# Patient Record
Sex: Female | Born: 1965 | Race: White | Hispanic: No | Marital: Married | Smoking: Former smoker
Health system: Southern US, Community
[De-identification: ages and names within clinical notes are randomized; demographics above are authoritative.]

## PROBLEM LIST (undated history)

## (undated) DIAGNOSIS — R7303 Prediabetes: Secondary | ICD-10-CM

## (undated) DIAGNOSIS — I10 Essential (primary) hypertension: Secondary | ICD-10-CM

## (undated) DIAGNOSIS — E7800 Pure hypercholesterolemia, unspecified: Secondary | ICD-10-CM

## (undated) DIAGNOSIS — M1990 Unspecified osteoarthritis, unspecified site: Secondary | ICD-10-CM

## (undated) DIAGNOSIS — J302 Other seasonal allergic rhinitis: Secondary | ICD-10-CM

## (undated) HISTORY — PX: CHOLECYSTECTOMY: SHX55

## (undated) HISTORY — PX: ESSURE TUBAL LIGATION: SUR464

## (undated) HISTORY — PX: NO PAST SURGERIES: SHX2092

---

## 2009-09-19 ENCOUNTER — Encounter: Admission: RE | Admit: 2009-09-19 | Discharge: 2009-09-19 | Payer: Self-pay | Admitting: Obstetrics and Gynecology

## 2009-09-26 ENCOUNTER — Encounter: Admission: RE | Admit: 2009-09-26 | Discharge: 2009-09-26 | Payer: Self-pay | Admitting: Obstetrics and Gynecology

## 2010-04-03 ENCOUNTER — Encounter: Admission: RE | Admit: 2010-04-03 | Discharge: 2010-04-03 | Payer: Self-pay | Admitting: Family Medicine

## 2010-10-17 ENCOUNTER — Encounter
Admission: RE | Admit: 2010-10-17 | Discharge: 2010-10-17 | Payer: Self-pay | Source: Home / Self Care | Attending: Obstetrics and Gynecology | Admitting: Obstetrics and Gynecology

## 2010-10-26 ENCOUNTER — Encounter: Payer: Self-pay | Admitting: Obstetrics and Gynecology

## 2011-04-13 ENCOUNTER — Other Ambulatory Visit (HOSPITAL_COMMUNITY): Payer: Self-pay | Admitting: Obstetrics and Gynecology

## 2011-04-13 DIAGNOSIS — N971 Female infertility of tubal origin: Secondary | ICD-10-CM

## 2011-04-15 ENCOUNTER — Ambulatory Visit (HOSPITAL_COMMUNITY)
Admission: RE | Admit: 2011-04-15 | Discharge: 2011-04-15 | Disposition: A | Discharge status: Home / Self Care | Payer: BC Managed Care – PPO | Source: Ambulatory Visit | Attending: Obstetrics and Gynecology | Admitting: Obstetrics and Gynecology

## 2011-04-15 DIAGNOSIS — N971 Female infertility of tubal origin: Secondary | ICD-10-CM

## 2011-04-15 DIAGNOSIS — Z3049 Encounter for surveillance of other contraceptives: Secondary | ICD-10-CM | POA: Insufficient documentation

## 2011-04-15 MED ORDER — IOHEXOL 300 MG/ML  SOLN: 3.0000 mL | Freq: Once | INTRAMUSCULAR | Status: AC | PRN

## 2011-05-06 ENCOUNTER — Other Ambulatory Visit: Payer: Self-pay

## 2011-05-06 ENCOUNTER — Encounter (HOSPITAL_COMMUNITY)
Admission: RE | Admit: 2011-05-06 | Discharge: 2011-05-06 | Disposition: A | Discharge status: Home / Self Care | Payer: BC Managed Care – PPO | Source: Ambulatory Visit | Attending: Obstetrics and Gynecology | Admitting: Obstetrics and Gynecology

## 2011-05-06 ENCOUNTER — Encounter (HOSPITAL_COMMUNITY): Payer: Self-pay

## 2011-05-06 HISTORY — DX: Other seasonal allergic rhinitis: J30.2

## 2011-05-06 HISTORY — DX: Essential (primary) hypertension: I10

## 2011-05-06 LAB — CBC
HCT: 39.7 % (ref 36.0–46.0)
MCHC: 32.5 g/dL (ref 30.0–36.0)
MCV: 90.6 fL (ref 78.0–100.0)
Platelets: 300 10*3/uL (ref 150–400)
RDW: 13.1 % (ref 11.5–15.5)
WBC: 9.5 10*3/uL (ref 4.0–10.5)

## 2011-05-06 NOTE — Anesthesia Preprocedure Evaluation (Signed)
Anesthesia Evaluation  Name, MR# and DOB Patient awake  General Assessment Comment  Reviewed: Allergy & Precautions, H&P , Patient's Chart, lab work & pertinent test results and reviewed documented beta blocker date and time   Airway Mallampati: I TM Distance: >3 FB Neck ROM: full    Dental   Pulmonary  clear to auscultation    Cardiovascular hypertension (on bystolic, takes at night, will take night before surgery), Pt. on home beta blockers regular Normal Had cardiac workup because of family history of early CAD - w/u was neg.  Has baseline EKG abnormality, but unchanged over past year.Had cardiac workup because of family history of early CAD - w/u was neg.  Has baseline EKG abnormality, but unchanged over past year.:    Neuro/Psych  GI/Hepatic/Renal   Endo/Other   (+)  Morbid obesity Abdominal   Musculoskeletal  Hematology   Peds  Reproductive/Obstetrics   Anesthesia Other Findings Top two front teeth grooved            Anesthesia Physical Anesthesia Plan  ASA: III  Anesthesia Plan: General   Post-op Pain Management:    Induction:   Airway Management Planned:   Additional Equipment:   Intra-op Plan:   Post-operative Plan:   Informed Consent: I have reviewed the patients History and Physical, chart, labs and discussed the procedure including the risks, benefits and alternatives for the proposed anesthesia with the patient or authorized representative who has indicated his/her understanding and acceptance.   Dental Advisory Given  Plan Discussed with:   Anesthesia Plan Comments:         Anesthesia Quick Evaluation

## 2011-05-06 NOTE — Patient Instructions (Addendum)
22 Sonya Watson  05/06/2011   Your procedure is scheduled on:  Wednesday  Enter through the Main Entrance of [facility] at 7 AM.  Pick up the phone at the desk and dial [phone].   Call this number if you have problems the morning of surgery: [phone]   Remember:   Do not eat food:After Midnight.  Do not drink clear liquids: After Midnight.  Take these medicines the morning of surgery with A SIP OF WATER: take bystolic as scheduled   Do not wear jewelry, make-up or nail polish.  Do not wear lotions, powders, or perfumes.   Do not shave 48 hours prior to surgery.  Do not bring valuables to the hospital.  Contacts, dentures or bridgework may not be worn into surgery.  Leave suitcase in the car. After surgery it may be brought to your room.  For patients admitted to the hospital, checkout time is 11:00 AM the day of discharge.   Patients discharged the day of surgery will not be allowed to drive home.  Name and phone number of your driver: Yasmene Salomone-  Special Instructions: none   Please read over the following fact sheets that you were given: hibiclens as instructed

## 2011-05-09 NOTE — H&P (Addendum)
Sonya Watson is an 45 y.o. female with an ongoing problem of menorrhagia for several years.  She has tried to manage this with many different brands of OCP's But has found that bleeding got worse with more BTB.  Cycles are every 21 days and have lasted up to 2 weeks at times.  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  She has had an Essure procedure in the past, recent HSG confirmed occlusion and a normal cavity.  Pertinent Gynecological History:   Contraception: Essure  Last pap: normal     Menstrual History:  Patient's last menstrual period was 04/06/2011.    Past Medical History  Diagnosis Date  . Hypertension     on bystolic-diagnosed htn 1 year ago  . Seasonal allergies     Past Surgical History  Procedure Date  . No past surgeries     No family history on file.  Social History:  reports that she has quit smoking. She quit smokeless tobacco use about 5 years ago. She reports that she drinks alcohol. She reports that she does not use illicit drugs.  Allergies: No Known Allergies  No prescriptions prior to admission    ROS  Last menstrual period 04/06/2011. Physical Exam  Constitutional: She is oriented to person, place, and time. She appears well-developed and well-nourished.  Cardiovascular: Normal rate and regular rhythm.   Respiratory: Effort normal and breath sounds normal.  GI: Soft. Bowel sounds are normal.  Genitourinary: Vagina normal and uterus normal.  Neurological: She is alert and oriented to person, place, and time.  Psychiatric: Her behavior is normal.       Assessment/Plan: Pt coming in for scheduled Novasure ablation.  Will have hysteroscopy and D&C to sample lining at the time of surgery.  HSG showed normal cavitiy prior to admission and Successful tubal occlusion with Essure.  Risks and benefits d/w pt in detail including uterine perforation, bleeding,or infection.  Pt desires to proceed.  Will use cytotec  3 hours prior to procedure. Given Script for vicodin preop to fill for use after procedure #20.  Illeana Edick W 05/09/2011, 12:02 AM  Per pt no changes have occurred in dictated in H&P.

## 2011-05-13 ENCOUNTER — Ambulatory Visit (HOSPITAL_COMMUNITY): Payer: BC Managed Care – PPO | Admitting: Anesthesiology

## 2011-05-13 ENCOUNTER — Encounter (HOSPITAL_COMMUNITY): Payer: Self-pay | Admitting: Obstetrics and Gynecology

## 2011-05-13 ENCOUNTER — Ambulatory Visit (HOSPITAL_COMMUNITY)
Admission: RE | Admit: 2011-05-13 | Discharge: 2011-05-13 | Disposition: A | Discharge status: Home / Self Care | Payer: BC Managed Care – PPO | Source: Ambulatory Visit | Attending: Obstetrics and Gynecology | Admitting: Obstetrics and Gynecology

## 2011-05-13 ENCOUNTER — Encounter (HOSPITAL_COMMUNITY)
Admission: RE | Disposition: A | Discharge status: Home / Self Care | Payer: Self-pay | Source: Ambulatory Visit | Attending: Obstetrics and Gynecology

## 2011-05-13 ENCOUNTER — Encounter (HOSPITAL_COMMUNITY): Payer: Self-pay | Admitting: Anesthesiology

## 2011-05-13 ENCOUNTER — Other Ambulatory Visit: Payer: Self-pay | Admitting: Obstetrics and Gynecology

## 2011-05-13 DIAGNOSIS — N938 Other specified abnormal uterine and vaginal bleeding: Secondary | ICD-10-CM

## 2011-05-13 DIAGNOSIS — Z01818 Encounter for other preprocedural examination: Secondary | ICD-10-CM | POA: Insufficient documentation

## 2011-05-13 DIAGNOSIS — Z01812 Encounter for preprocedural laboratory examination: Secondary | ICD-10-CM | POA: Insufficient documentation

## 2011-05-13 DIAGNOSIS — N920 Excessive and frequent menstruation with regular cycle: Secondary | ICD-10-CM | POA: Insufficient documentation

## 2011-05-13 SURGERY — DILATATION & CURETTAGE/HYSTEROSCOPY WITH NOVASURE ABLATION
Anesthesia: Choice | Site: Vagina | Wound class: Clean Contaminated

## 2011-05-13 MED ORDER — LACTATED RINGERS IV SOLN
INTRAVENOUS | Status: DC
  Administered 2011-05-13 (×3): via INTRAVENOUS

## 2011-05-13 MED ORDER — FENTANYL CITRATE 0.05 MG/ML IJ SOLN
INTRAMUSCULAR | Status: DC | PRN
  Administered 2011-05-13 (×2): 50 ug via INTRAVENOUS

## 2011-05-13 MED ORDER — METOCLOPRAMIDE HCL 10 MG PO TABS: 10.0000 mg | ORAL_TABLET | Freq: Once | ORAL | Status: DC | PRN

## 2011-05-13 MED ORDER — FENTANYL CITRATE 0.05 MG/ML IJ SOLN
25.0000 ug | INTRAMUSCULAR | Status: DC | PRN
  Administered 2011-05-13: 25 ug via INTRAVENOUS

## 2011-05-13 MED ORDER — FAMOTIDINE 20 MG PO TABS: 20.0000 mg | ORAL_TABLET | Freq: Once | ORAL | Status: DC | PRN

## 2011-05-13 MED ORDER — SCOPOLAMINE 1 MG/3DAYS TD PT72: 1.0000 | MEDICATED_PATCH | Freq: Once | TRANSDERMAL | Status: DC | PRN

## 2011-05-13 MED ORDER — FENTANYL CITRATE 0.05 MG/ML IJ SOLN: 25.0000 ug | INTRAMUSCULAR | Status: DC | PRN

## 2011-05-13 MED ORDER — PANTOPRAZOLE SODIUM 40 MG PO TBEC: 40.0000 mg | DELAYED_RELEASE_TABLET | Freq: Once | ORAL | Status: DC | PRN

## 2011-05-13 MED ORDER — LIDOCAINE HCL (CARDIAC) 20 MG/ML IV SOLN
INTRAVENOUS | Status: DC | PRN
  Administered 2011-05-13: 60 mg via INTRAVENOUS

## 2011-05-13 MED ORDER — ONDANSETRON HCL 4 MG/2ML IJ SOLN
INTRAMUSCULAR | Status: DC | PRN
  Administered 2011-05-13: 4 mg via INTRAVENOUS

## 2011-05-13 MED ORDER — PROPOFOL 10 MG/ML IV EMUL
INTRAVENOUS | Status: DC | PRN
  Administered 2011-05-13: 200 mg via INTRAVENOUS

## 2011-05-13 MED ORDER — KETOROLAC TROMETHAMINE 30 MG/ML IJ SOLN
INTRAMUSCULAR | Status: DC | PRN
  Administered 2011-05-13: 30 mg via INTRAVENOUS

## 2011-05-13 MED ORDER — LIDOCAINE HCL 1 % IJ SOLN
INTRAMUSCULAR | Status: DC | PRN
  Administered 2011-05-13: 20 mL via INTRADERMAL

## 2011-05-13 MED ORDER — DEXAMETHASONE SODIUM PHOSPHATE 4 MG/ML IJ SOLN
INTRAMUSCULAR | Status: DC | PRN
  Administered 2011-05-13: 10 mg via INTRAVENOUS

## 2011-05-13 MED ORDER — MIDAZOLAM HCL 5 MG/5ML IJ SOLN
INTRAMUSCULAR | Status: DC | PRN
  Administered 2011-05-13: 2 mg via INTRAVENOUS

## 2011-05-13 MED ORDER — LACTATED RINGERS IV SOLN
INTRAVENOUS | Status: DC
  Administered 2011-05-13: 08:00:00 via INTRAVENOUS

## 2011-05-13 MED ORDER — CITRIC ACID-SODIUM CITRATE 334-500 MG/5ML PO SOLN: 30.0000 mL | Freq: Once | ORAL | Status: DC | PRN

## 2011-05-13 MED ORDER — LACTATED RINGERS IV SOLN
INTRAVENOUS | Status: DC | PRN
  Administered 2011-05-13: 1000 mL via INTRAUTERINE

## 2011-05-13 MED ORDER — KETOROLAC TROMETHAMINE 30 MG/ML IJ SOLN: 15.0000 mg | Freq: Once | INTRAMUSCULAR | Status: DC | PRN

## 2011-05-13 SURGICAL SUPPLY — 16 items
ABLATOR ENDOMETRIAL BIPOLAR (ABLATOR) ×2 IMPLANT
CANISTER SUCTION 2500CC (MISCELLANEOUS) ×2 IMPLANT
CATH ROBINSON RED A/P 16FR (CATHETERS) ×2 IMPLANT
CLOTH BEACON ORANGE TIMEOUT ST (SAFETY) ×2 IMPLANT
CONTAINER PREFILL 10% NBF 60ML (FORM) ×4 IMPLANT
DRAPE BUTTOCK UNDER FLUID (DRAPE) ×2 IMPLANT
DRAPE UTILITY XL STRL (DRAPES) ×2 IMPLANT
ELECT REM PT RETURN 9FT ADLT (ELECTROSURGICAL)
ELECTRODE REM PT RTRN 9FT ADLT (ELECTROSURGICAL) IMPLANT
GLOVE BIO SURGEON STRL SZ8 (GLOVE) ×2 IMPLANT
GLOVE ORTHO TXT STRL SZ7.5 (GLOVE) ×2 IMPLANT
GOWN PREVENTION PLUS LG XLONG (DISPOSABLE) ×2 IMPLANT
GOWN PREVENTION PLUS XLARGE (GOWN DISPOSABLE) ×2 IMPLANT
LOOP ANGLED CUTTING 22FR (CUTTING LOOP) IMPLANT
PACK HYSTEROSCOPY LF (CUSTOM PROCEDURE TRAY) ×2 IMPLANT
TOWEL OR 17X24 6PK STRL BLUE (TOWEL DISPOSABLE) ×4 IMPLANT

## 2011-05-13 NOTE — Transfer of Care (Signed)
Immediate Anesthesia Transfer of Care Note  Patient: Sonya Watson  Procedure(s) Performed:  DILATATION & CURETTAGE/HYSTEROSCOPY WITH NOVASURE ABLATION - attempted novasure  Patient Location: PACU  Anesthesia Type: General  Level of Consciousness: awake, alert , oriented, patient cooperative and responds to stimulation  Airway & Oxygen Therapy: Patient Spontanous Breathing and Patient connected to nasal cannula oxygen  Post-op Assessment: Report given to PACU RN, Post -op Vital signs reviewed and stable and Patient moving all extremities  Post vital signs: Reviewed and stable  Complications: No apparent anesthesia complications

## 2011-05-13 NOTE — Brief Op Note (Signed)
05/13/2011  9:31 AM  PATIENT:  Sonya Watson  45 y.o. female  PRE-OPERATIVE DIAGNOSIS:  Dysfunctional uterine bleeding [626.8]  POST-OPERATIVE DIAGNOSIS:  dysfunctional uterine bleeding  PROCEDURE:  Procedure(s): DILATATION & CURETTAGE/HYSTEROSCOPY WITH Attempted NOVASURE ABLATION  SURGEON:  Surgeon(s): Oliver Pila  ANESTHESIA:   local and IV sedation with LMA  ESTIMATED BLOOD LOSS:50cc Hysteroscopic Deficit 100cc (most on floor)   LOCAL MEDICATIONS USED:  1% plain LIDOCAINE 20CC  SPECIMEN: endometrial currettings DISPOSITION OF SPECIMEN:  PATHOLOGY  COUNTS:  YES  Findings  Cavity was small and narrow.  Sounded 8cm and cervix 4cm length.  Left coil not visible, right coil with 10 coils visible trailing.  Cavitiy width would not exceed 2.0 despite Numerous manipulatiions and attempts.    PATIENT DISPOSITION:  PACU - hemodynamically stable.

## 2011-05-13 NOTE — Anesthesia Postprocedure Evaluation (Signed)
Anesthesia Post Note  Patient: Sonya Watson  Procedure(s) Performed:  DILATATION & CURETTAGE/HYSTEROSCOPY WITH NOVASURE ABLATION - attempted novasure  Anesthesia type: General  Patient location: PACU  Post pain: Pain level controlled  Post assessment: Post-op Vital signs reviewed  Last Vitals:  Filed Vitals:   05/13/11 1045  BP: 142/83  Pulse: 61  Temp:   Resp: 12    Post vital signs: Reviewed  Level of consciousness: sedated  Complications: No apparent anesthesia complications

## 2011-05-13 NOTE — Op Note (Signed)
Preop diagnosis  #1 menorrhagia                             #2 dysfunctional uterine bleeding                             #3 status post Essure coil placement over a year ago with HSG confirming closure  Postoperative diagnosis same with near her uterine cavity and right Essure coil trailing into cavity  Procedure hysteroscopy D&C and attempted NovaSure ablation  Surgeon Dr. Huel Cote  Anesthesia LMA and 1% lidocaine paracervical block  Fluids estimated blood loss 50 cc            Hysteroscopic deficit 100 cc            IV fluids 1500 cc LR  Findings the patient's uterine cavity sounded to 8 the cervical length was 4, the cavity itself appeared narrow and slightly arcuate in shape. The left cornua had no visible coils the right cornua had approximately 8-10 trailing coils visible. There were no other abnormalities in the cavity no submucosal fibroids or polyps. The cavity width would not exceed 2 cm despite manipulation.  Procedure note Patient was taken to the operating room after informed consent was obtained. After an appropriate time out was performed she was prepped and draped in the normal sterile fashion in the dorsal lithotomy position. A speculum was placed within the vagina and the cervix identified and injected with 2 cc of 1% plain lidocaine on the anterior lip this was then grasped with a single-tooth tenaculum. The cervix was then easily sounded and the [facility] sequential dilators passed easily due to prior Cytotec treatment.  The cervical length was noted to be 4. The hysteroscope was then introduced into the cavity and the endometrium appeared normal with no polyps or submucosal fibroids noted. The left cornua had no visible coils seen the right cornua had approximately 8-10 trailing coils from her prior Essure procedure protruding. The hysteroscope was then removed and the endometrial lining gently curettaged to obtain a specimen. This was handed off and the NovaSure unit  opened. The NovaSure device was then introduced into the cavity easily and attempt was made to deploy the device. The cavity width would not exceed 2.0 cm despite multiple manipulations. The NovaSure rep was present and several other attempts were made with no success. The cavity itself did appear were narrow and possibly the corneal itself was interfering with deployment. This was then removed and the hysteroscope reintroduced. There were no perforations the left cornua still had no visible coils the right cornua still had the trailing Essure implant seen and possibly appeared slightly longer than when starting the procedure. The NovaSure was then aborted and the patient awakened and taken to the recovery room in good condition. All instruments and sponge counts were correct.

## 2011-09-23 ENCOUNTER — Other Ambulatory Visit: Payer: Self-pay | Admitting: Obstetrics and Gynecology

## 2011-09-23 DIAGNOSIS — Z1231 Encounter for screening mammogram for malignant neoplasm of breast: Secondary | ICD-10-CM

## 2011-10-19 ENCOUNTER — Ambulatory Visit: Payer: BC Managed Care – PPO

## 2011-10-26 ENCOUNTER — Ambulatory Visit
Admission: RE | Admit: 2011-10-26 | Discharge: 2011-10-26 | Disposition: A | Discharge status: Home / Self Care | Payer: BC Managed Care – PPO | Source: Ambulatory Visit | Attending: Obstetrics and Gynecology | Admitting: Obstetrics and Gynecology

## 2011-10-26 DIAGNOSIS — Z1231 Encounter for screening mammogram for malignant neoplasm of breast: Secondary | ICD-10-CM

## 2012-10-11 ENCOUNTER — Other Ambulatory Visit: Payer: Self-pay | Admitting: Obstetrics and Gynecology

## 2012-10-11 DIAGNOSIS — Z1231 Encounter for screening mammogram for malignant neoplasm of breast: Secondary | ICD-10-CM

## 2012-11-04 ENCOUNTER — Ambulatory Visit
Admission: RE | Admit: 2012-11-04 | Discharge: 2012-11-04 | Disposition: A | Discharge status: Home / Self Care | Payer: Federal, State, Local not specified - PPO | Source: Ambulatory Visit | Attending: Obstetrics and Gynecology | Admitting: Obstetrics and Gynecology

## 2012-11-04 DIAGNOSIS — Z1231 Encounter for screening mammogram for malignant neoplasm of breast: Secondary | ICD-10-CM

## 2013-10-18 ENCOUNTER — Other Ambulatory Visit: Payer: Self-pay

## 2013-10-18 DIAGNOSIS — Z1231 Encounter for screening mammogram for malignant neoplasm of breast: Secondary | ICD-10-CM

## 2013-11-07 ENCOUNTER — Ambulatory Visit: Payer: Federal, State, Local not specified - PPO

## 2013-11-17 ENCOUNTER — Ambulatory Visit: Payer: Federal, State, Local not specified - PPO

## 2013-11-27 ENCOUNTER — Ambulatory Visit
Admission: RE | Admit: 2013-11-27 | Discharge: 2013-11-27 | Disposition: A | Discharge status: Home / Self Care | Payer: Federal, State, Local not specified - PPO | Source: Ambulatory Visit

## 2013-11-27 DIAGNOSIS — Z1231 Encounter for screening mammogram for malignant neoplasm of breast: Secondary | ICD-10-CM

## 2017-10-13 ENCOUNTER — Emergency Department (HOSPITAL_BASED_OUTPATIENT_CLINIC_OR_DEPARTMENT_OTHER)
Admission: EM | Admit: 2017-10-13 | Discharge: 2017-10-13 | Disposition: A | Discharge status: Home / Self Care | Payer: Federal, State, Local not specified - PPO | Attending: Emergency Medicine | Admitting: Emergency Medicine

## 2017-10-13 ENCOUNTER — Other Ambulatory Visit: Payer: Self-pay

## 2017-10-13 ENCOUNTER — Encounter (HOSPITAL_BASED_OUTPATIENT_CLINIC_OR_DEPARTMENT_OTHER): Payer: Self-pay

## 2017-10-13 DIAGNOSIS — Z5321 Procedure and treatment not carried out due to patient leaving prior to being seen by health care provider: Secondary | ICD-10-CM | POA: Insufficient documentation

## 2017-10-13 DIAGNOSIS — R05 Cough: Secondary | ICD-10-CM | POA: Insufficient documentation

## 2017-10-13 HISTORY — DX: Pure hypercholesterolemia, unspecified: E78.00

## 2017-10-13 NOTE — ED Notes (Signed)
Pt informed registration she was leaving  

## 2017-10-13 NOTE — ED Triage Notes (Signed)
C/o flu like sx day 2-NAD-steady gait 

## 2018-08-03 ENCOUNTER — Other Ambulatory Visit: Payer: Self-pay | Admitting: Obstetrics and Gynecology

## 2018-08-03 DIAGNOSIS — R928 Other abnormal and inconclusive findings on diagnostic imaging of breast: Secondary | ICD-10-CM

## 2018-08-09 ENCOUNTER — Other Ambulatory Visit: Payer: Self-pay | Admitting: Obstetrics and Gynecology

## 2018-08-09 ENCOUNTER — Ambulatory Visit
Admission: RE | Admit: 2018-08-09 | Discharge: 2018-08-09 | Disposition: A | Discharge status: Home / Self Care | Payer: Federal, State, Local not specified - PPO | Source: Ambulatory Visit | Attending: Obstetrics and Gynecology | Admitting: Obstetrics and Gynecology

## 2018-08-09 DIAGNOSIS — R928 Other abnormal and inconclusive findings on diagnostic imaging of breast: Secondary | ICD-10-CM

## 2018-08-09 DIAGNOSIS — R921 Mammographic calcification found on diagnostic imaging of breast: Secondary | ICD-10-CM

## 2018-08-16 ENCOUNTER — Ambulatory Visit
Admission: RE | Admit: 2018-08-16 | Discharge: 2018-08-16 | Disposition: A | Discharge status: Home / Self Care | Payer: Federal, State, Local not specified - PPO | Source: Ambulatory Visit | Attending: Obstetrics and Gynecology | Admitting: Obstetrics and Gynecology

## 2018-08-16 DIAGNOSIS — R921 Mammographic calcification found on diagnostic imaging of breast: Secondary | ICD-10-CM

## 2021-04-29 ENCOUNTER — Other Ambulatory Visit: Payer: Self-pay | Admitting: Obstetrics and Gynecology

## 2021-04-29 DIAGNOSIS — R928 Other abnormal and inconclusive findings on diagnostic imaging of breast: Secondary | ICD-10-CM

## 2021-05-15 ENCOUNTER — Ambulatory Visit: Payer: Federal, State, Local not specified - PPO

## 2021-05-15 ENCOUNTER — Ambulatory Visit
Admission: RE | Admit: 2021-05-15 | Discharge: 2021-05-15 | Disposition: A | Discharge status: Home / Self Care | Payer: Federal, State, Local not specified - PPO | Source: Ambulatory Visit | Attending: Obstetrics and Gynecology | Admitting: Obstetrics and Gynecology

## 2021-05-15 ENCOUNTER — Other Ambulatory Visit: Payer: Self-pay

## 2021-05-15 DIAGNOSIS — R928 Other abnormal and inconclusive findings on diagnostic imaging of breast: Secondary | ICD-10-CM

## 2023-03-18 ENCOUNTER — Ambulatory Visit: Payer: Self-pay | Admitting: Orthopedic Surgery

## 2023-03-18 DIAGNOSIS — M5126 Other intervertebral disc displacement, lumbar region: Secondary | ICD-10-CM

## 2023-03-23 NOTE — Pre-Procedure Instructions (Signed)
Surgical Instructions   Your procedure is scheduled on Thursday, June 27th. Report to [facility] Main Entrance "A" at 05:30 A.M., then check in with the Admitting office. Any questions or running late day of surgery: call [phone]  Questions prior to your surgery date: call [phone], Monday-Friday, 8am-4pm. If you experience any cold or flu symptoms such as cough, fever, chills, shortness of breath, etc. between now and your scheduled surgery, please notify us at the above number.     Remember:  Do not eat after midnight the night before your surgery  You may drink clear liquids until 04:30 AM the morning of your surgery.   Clear liquids allowed are: Water, Non-Citrus Juices (without pulp), Carbonated Beverages, Clear Tea, Black Coffee Only (NO MILK, CREAM OR POWDERED CREAMER of any kind), and Gatorade.   Patient Instructions  The night before surgery:  No food after midnight. ONLY clear liquids after midnight  The day of surgery (if you do NOT have diabetes):  Drink ONE (1) Pre-Surgery Clear Ensure by 04:30 AM the morning of surgery. Drink in one sitting. Do not sip.  This drink was given to you during your hospital  pre-op appointment visit.  Nothing else to drink after completing the  Pre-Surgery Clear Ensure.          If you have questions, please contact your surgeon's office.     Take these medicines the morning of surgery with A SIP OF WATER  atorvastatin (LIPITOR)  cyclobenzaprine (FLEXERIL)    May take these medicines IF NEEDED: oxyCODONE-acetaminophen (PERCOCET/ROXICET)     STOP TAKING OZEMPIC 7 DAYS PRIOR TO SURGERY  One week prior to surgery, STOP taking any Aspirin (unless otherwise instructed by your surgeon) Aleve, Naproxen, Ibuprofen, Motrin, meloxicam (MOBIC), Advil, Goody's, BC's, all herbal medications, fish oil, and non-prescription vitamins.                     Do NOT Smoke (Tobacco/Vaping) for 24 hours prior to your procedure.  If you  use a CPAP at night, you may bring your mask/headgear for your overnight stay.   You will be asked to remove any contacts, glasses, piercing's, hearing aid's, dentures/partials prior to surgery. Please bring cases for these items if needed.    Patients discharged the day of surgery will not be allowed to drive home, and someone needs to stay with them for 24 hours.  SURGICAL WAITING ROOM VISITATION Patients may have no more than 2 support people in the waiting area - these visitors may rotate.   Pre-op nurse will coordinate an appropriate time for 1 ADULT support person, who may not rotate, to accompany patient in pre-op.  Children under the age of 82 must have an adult with them who is not the patient and must remain in the main waiting area with an adult.  If the patient needs to stay at the hospital during part of their recovery, the visitor guidelines for inpatient rooms apply.  Please refer to the [facility] website for the visitor guidelines for any additional information.   If you received a COVID test during your pre-op visit  it is requested that you wear a mask when out in public, stay away from anyone that may not be feeling well and notify your surgeon if you develop symptoms. If you have been in contact with anyone that has tested positive in the last 10 days please notify you surgeon.      Pre-operative 5 CHG Bath Instructions  You can play a key role in reducing the risk of infection after surgery. Your skin needs to be as free of germs as possible. You can reduce the number of germs on your skin by washing with CHG (chlorhexidine gluconate) soap before surgery. CHG is an antiseptic soap that kills germs and continues to kill germs even after washing.   DO NOT use if you have an allergy to chlorhexidine/CHG or antibacterial soaps. If your skin becomes reddened or irritated, stop using the CHG and notify one of our RNs at [phone].   Please shower with the CHG soap  starting 4 days before surgery using the following schedule:     Please keep in mind the following:  DO NOT shave, including legs and underarms, starting the day of your first shower.   You may shave your face at any point before/day of surgery.  Place clean sheets on your bed the day you start using CHG soap. Use a clean washcloth (not used since being washed) for each shower. DO NOT sleep with pets once you start using the CHG.   CHG Shower Instructions:  If you choose to wash your hair and private area, wash first with your normal shampoo/soap.  After you use shampoo/soap, rinse your hair and body thoroughly to remove shampoo/soap residue.  Turn the water OFF and apply about 3 tablespoons (45 ml) of CHG soap to a CLEAN washcloth.  Apply CHG soap ONLY FROM YOUR NECK DOWN TO YOUR TOES (washing for 3-5 minutes)  DO NOT use CHG soap on face, private areas, open wounds, or sores.  Pay special attention to the area where your surgery is being performed.  If you are having back surgery, having someone wash your back for you may be helpful. Wait 2 minutes after CHG soap is applied, then you may rinse off the CHG soap.  Pat dry with a clean towel  Put on clean clothes/pajamas   If you choose to wear lotion, please use ONLY the CHG-compatible lotions on the back of this paper.   Additional instructions for the day of surgery: DO NOT APPLY any lotions, deodorants, cologne, or perfumes.   Do not bring valuables to the hospital. [facility] is not responsible for any belongings/valuables. Do not wear nail polish, gel polish, artificial nails, or any other type of covering on natural nails (fingers and toes) Do not wear jewelry or makeup Put on clean/comfortable clothes.  Please brush your teeth.  Ask your nurse before applying any prescription medications to the skin.     CHG Compatible Lotions   Aveeno Moisturizing lotion  Cetaphil Moisturizing Cream  Cetaphil Moisturizing Lotion   Clairol Herbal Essence Moisturizing Lotion, Dry Skin  Clairol Herbal Essence Moisturizing Lotion, Extra Dry Skin  Clairol Herbal Essence Moisturizing Lotion, Normal Skin  Curel Age Defying Therapeutic Moisturizing Lotion with Alpha Hydroxy  Curel Extreme Care Body Lotion  Curel Soothing Hands Moisturizing Hand Lotion  Curel Therapeutic Moisturizing Cream, Fragrance-Free  Curel Therapeutic Moisturizing Lotion, Fragrance-Free  Curel Therapeutic Moisturizing Lotion, Original Formula  Eucerin Daily Replenishing Lotion  Eucerin Dry Skin Therapy Plus Alpha Hydroxy Crme  Eucerin Dry Skin Therapy Plus Alpha Hydroxy Lotion  Eucerin Original Crme  Eucerin Original Lotion  Eucerin Plus Crme Eucerin Plus Lotion  Eucerin TriLipid Replenishing Lotion  Keri Anti-Bacterial Hand Lotion  Keri Deep Conditioning Original Lotion Dry Skin Formula Softly Scented  Keri Deep Conditioning Original Lotion, Fragrance Free Sensitive Skin Formula  Keri Lotion Fast Absorbing Fragrance Free Sensitive  Skin Formula  Keri Lotion Fast Absorbing Softly Scented Dry Skin Formula  Keri Original Lotion  Keri Skin Renewal Lotion Keri Silky Smooth Lotion  Keri Silky Smooth Sensitive Skin Lotion  Nivea Body Creamy Conditioning Oil  Nivea Body Extra Enriched Lotion  Nivea Body Original Lotion  Nivea Body Sheer Moisturizing Lotion Nivea Crme  Nivea Skin Firming Lotion  NutraDerm 30 Skin Lotion  NutraDerm Skin Lotion  NutraDerm Therapeutic Skin Cream  NutraDerm Therapeutic Skin Lotion  ProShield Protective Hand Cream  Provon moisturizing lotion  Please read over the following fact sheets that you were given.

## 2023-03-24 ENCOUNTER — Ambulatory Visit: Payer: Self-pay | Admitting: Orthopedic Surgery

## 2023-03-24 ENCOUNTER — Other Ambulatory Visit: Payer: Self-pay

## 2023-03-24 ENCOUNTER — Ambulatory Visit (HOSPITAL_COMMUNITY)
Admission: RE | Admit: 2023-03-24 | Discharge: 2023-03-24 | Disposition: A | Discharge status: Home / Self Care | Payer: Federal, State, Local not specified - PPO | Source: Ambulatory Visit | Attending: Orthopedic Surgery | Admitting: Orthopedic Surgery

## 2023-03-24 ENCOUNTER — Encounter (HOSPITAL_COMMUNITY): Payer: Self-pay | Admitting: *Deleted

## 2023-03-24 ENCOUNTER — Encounter (HOSPITAL_COMMUNITY)
Admission: RE | Admit: 2023-03-24 | Discharge: 2023-03-24 | Disposition: A | Discharge status: Home / Self Care | Payer: Federal, State, Local not specified - PPO | Source: Ambulatory Visit | Attending: Specialist | Admitting: Specialist

## 2023-03-24 VITALS — BP 135/79 | HR 60 | Temp 97.9°F | Resp 18 | Ht 67.0 in | Wt 251.0 lb

## 2023-03-24 DIAGNOSIS — M5126 Other intervertebral disc displacement, lumbar region: Secondary | ICD-10-CM | POA: Diagnosis not present

## 2023-03-24 DIAGNOSIS — I10 Essential (primary) hypertension: Secondary | ICD-10-CM | POA: Insufficient documentation

## 2023-03-24 DIAGNOSIS — Z01818 Encounter for other preprocedural examination: Secondary | ICD-10-CM | POA: Insufficient documentation

## 2023-03-24 DIAGNOSIS — E785 Hyperlipidemia, unspecified: Secondary | ICD-10-CM | POA: Diagnosis not present

## 2023-03-24 DIAGNOSIS — R7303 Prediabetes: Secondary | ICD-10-CM | POA: Diagnosis not present

## 2023-03-24 HISTORY — DX: Unspecified osteoarthritis, unspecified site: M19.90

## 2023-03-24 HISTORY — DX: Prediabetes: R73.03

## 2023-03-24 LAB — SURGICAL PCR SCREEN
MRSA, PCR: NEGATIVE
Staphylococcus aureus: NEGATIVE

## 2023-03-24 NOTE — H&P (View-Only) (Signed)
Sonya Watson is an 57 y.o. female.   Chief Complaint: back and right leg pain HPI: Reason for Visit: (normal) review of test results (lumbar mri results) Location (Lower Extremity): lower back pain Severity: pain level 12/10; severe Timing: constant Quality: sharp; aching Associated Symptoms: numbness/tingling Medications: prescribed prednisone, meloxicam, and gabapentin patient states she got confused with the medications and does not have any meloxicam to take  Patient reporting no relief. No fevers chills change in bowel or bladder function.  Past Medical History:  Diagnosis Date   Arthritis    High cholesterol    Hypertension    on bystolic-diagnosed htn 1 year ago   Pre-diabetes    Seasonal allergies     Past Surgical History:  Procedure Laterality Date   CHOLECYSTECTOMY     ESSURE TUBAL LIGATION     done in GYN office    No family history on file. Social History:  reports that she quit smoking about 17 years ago. Her smoking use included cigarettes. She has never used smokeless tobacco. She reports current alcohol use of about 7.0 standard drinks of alcohol per week. She reports that she does not use drugs.  Allergies:  Allergies  Allergen Reactions   Erythromycin Other (See Comments)    Stomach cramps    Current meds: atorvastatin 20 mg tablet losartan 100 mg-hydrochlorothiazide 25 mg tablet nebivoloL 10 mg tablet oxyCODONE-acetaminophen 5 mg-325 mg tablet Ozempic 1 mg/dose (4 mg/3 mL) subcutaneous pen injector quercetin 500 mg capsule XyzaL 5 mg tablet  Results for orders placed or performed during the hospital encounter of 03/24/23 (from the past 48 hour(s))  Surgical pcr screen     Status: None   Collection Time: 03/24/23  9:48 AM   Specimen: Nasal Mucosa; Nasal Swab  Result Value Ref Range   MRSA, PCR NEGATIVE NEGATIVE   Staphylococcus aureus NEGATIVE NEGATIVE    Comment: (NOTE) The Xpert SA Assay (FDA approved for NASAL specimens in patients  22 years of age and older), is one component of a comprehensive surveillance program. It is not intended to diagnose infection nor to guide or monitor treatment. Performed at [facility], [address] [place], [place] [address]    No results found.  Review of Systems  Constitutional: Negative.   HENT: Negative.    Eyes: Negative.   Respiratory: Negative.    Cardiovascular: Negative.   Gastrointestinal: Negative.   Endocrine: Negative.   Genitourinary: Negative.   Musculoskeletal:  Positive for back pain and gait problem.  Neurological:  Positive for weakness and numbness.  Psychiatric/Behavioral: Negative.      There were no vitals taken for this visit. Physical Exam Constitutional:      Appearance: Normal appearance.  HENT:     Head: Normocephalic and atraumatic.     Right Ear: External ear normal.     Left Ear: External ear normal.     Nose: Nose normal.     Mouth/Throat:     Pharynx: Oropharynx is clear.  Eyes:     Conjunctiva/sclera: Conjunctivae normal.  Cardiovascular:     Rate and Rhythm: Normal rate and regular rhythm.     Pulses: Normal pulses.     Heart sounds: Normal heart sounds.  Pulmonary:     Effort: Pulmonary effort is normal.  Abdominal:     General: Bowel sounds are normal.  Musculoskeletal:     Cervical back: Normal range of motion.     Comments: Gait and Station: Appearance: ambulating with no assistive   devices and antalgic gait.  Constitutional: General Appearance: healthy-appearing and distress (mild).  Psychiatric: Mood and Affect: active and alert.  Cardiovascular System: Edema Right: none; Dorsalis and posterior tibial pulses 2+. Edema Left: none.  Abdomen: Inspection and Palpation: non-distended and no tenderness.  Skin: Inspection and palpation: no rash.  Lumbar Spine: Inspection: normal alignment. Bony Palpation of the Lumbar Spine: tender at lumbosacral junction.. Bony Palpation of the Right Hip: no tenderness of the  greater trochanter and tenderness of the SI joint; Pelvis stable. Bony Palpation of the Left Hip: no tenderness of the greater trochanter and tenderness of the SI joint. Soft Tissue Palpation on the Right: No flank pain with percussion. Active Range of Motion: limited flexion and extention.  Motor Strength: L1 Motor Strength on the Right: hip flexion iliopsoas 5/5. L1 Motor Strength on the Left: hip flexion iliopsoas 5/5. L2-L4 Motor Strength on the Right: knee extension quadriceps 5/5. L2-L4 Motor Strength on the Left: knee extension quadriceps 5/5. L5 Motor Strength on the Right: ankle dorsiflexion tibialis anterior 5/5 and great toe extension extensor hallucis longus 5/5. L5 Motor Strength on the Left: ankle dorsiflexion tibialis anterior 5/5 and great toe extension extensor hallucis longus 5/5. S1 Motor Strength on the Right: plantar flexion gastrocnemius 3/5. S1 Motor Strength on the Left: plantar flexion gastrocnemius 5/5.  Neurological System: Knee Reflex Right: normal (2). Knee Reflex Left: normal (2). Ankle Reflex Right: normal (2). Ankle Reflex Left: normal (2). Babinski Reflex Right: plantar reflex absent. Babinski Reflex Left: plantar reflex absent. Sensation on the Right: normal distal extremities. Sensation on the Left: normal distal extremities. Special Tests on the Right: no clonus of the ankle/knee and seated straight leg raising test positive. Special Tests on the Left: no clonus of the ankle/knee.  Skin:    General: Skin is warm and dry.  Neurological:     Mental Status: She is alert.    MRI demonstrates moderate right central foraminal disc herniation with mild to moderate cranial migration impinging upon the right L5 nerve roots. Contact of the L4 nerve root as well. Small central disc herniation L5-S1 with impingement on the left S1 nerve root and displacement of the left S2 nerve root. Severe disc degeneration with Modic changes at L5-S1.  No significant change from her MRI of  February.  Assessment/Plan Impression:  1. 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 No significant interval change in her MRI since previous with continued mass effect upon the L5 nerve root  Plan:  We discussed options at this point in time failing conservative treatment and with her significant pain and neurologic deficits would be wise to consider and microdiscectomy and a lumbar decompression L4-5. I do not feel at this point in time a concomitant fusion is indicated.  She would like to proceed I had an extensive discussion with the patient concerning the pathology relevant anatomy and treatment options. At this point exhausting conservative treatment and in the presence of a neurologic deficit we discussed microlumbar decompression. I discussed the risks and benefits including bleeding, infection, DVT, PE, anesthetic complications, worsening in their symptoms, improvement in their symptoms, C SF leakage, epidural fibrosis, need for future surgeries such as revision discectomy and lumbar fusion. I also indicated that this is an operation to basically decompress the nerve roots to allow recovery as opposed to fixing a herniated disc if it is encountered and that the incidence of recurrent chest disc herniation can approach 15%. Also that nerve   root recovery is variable and may not recover completely. Any ligament or bone that is contributing to compressing the nerves will be removed as well.  I discussed the operative course including overnight in the hospital. Immediate ambulation. Follow-up in 2 weeks for suture removal. 6 weeks until healing of the herniation and surgical incision followed by 6 weeks of reconditioning and strengthening of the core musculature. Also discussed the need to employ the concepts of disc pressure management and core motion following the surgery to minimize the risk of recurrent disc herniation. We  will obtain preoperative clearance i if necessary and proceed accordingly.  No history of DVT or MRSA. For nighttime pain A prescription for an opioid was given to be taken as directed for pain control. I discussed the risks including cognitive changes which may affect the ability to operate machinery and to drive. In addition the side effect of constipation as well as to avoid taking with conflicting medications that were discussed. The patient's prescription drug monitoring report was reviewed with no red flags noted.  We discussed nerve root healing. That it can take a period of time there may be some permanent residual deficits. Also residual pain. 10 to 15% chance of recurrent disc herniation possible need for fusion in the future.  Preoperative clearance.  Plan hemilaminotomy, microdiscectomy L4-5 right  Khristina Janota M Sundee Garland, PA-C for Dr Beane 03/24/2023, 12:42 PM    

## 2023-03-24 NOTE — H&P (Signed)
Sonya Watson is an 57 y.o. female.   Chief Complaint: back and right leg pain HPI: Reason for Visit: (normal) review of test results (lumbar mri results) Location (Lower Extremity): lower back pain Severity: pain level 12/10; severe Timing: constant Quality: sharp; aching Associated Symptoms: numbness/tingling Medications: prescribed prednisone, meloxicam, and gabapentin patient states she got confused with the medications and does not have any meloxicam to take  Patient reporting no relief. No fevers chills change in bowel or bladder function.  Past Medical History:  Diagnosis Date   Arthritis    High cholesterol    Hypertension    on bystolic-diagnosed htn 1 year ago   Pre-diabetes    Seasonal allergies     Past Surgical History:  Procedure Laterality Date   CHOLECYSTECTOMY     ESSURE TUBAL LIGATION     done in GYN office    No family history on file. Social History:  reports that she quit smoking about 17 years ago. Her smoking use included cigarettes. She has never used smokeless tobacco. She reports current alcohol use of about 7.0 standard drinks of alcohol per week. She reports that she does not use drugs.  Allergies:  Allergies  Allergen Reactions   Erythromycin Other (See Comments)    Stomach cramps    Current meds: atorvastatin 20 mg tablet losartan 100 mg-hydrochlorothiazide 25 mg tablet nebivoloL 10 mg tablet oxyCODONE-acetaminophen 5 mg-325 mg tablet Ozempic 1 mg/dose (4 mg/3 mL) subcutaneous pen injector quercetin 500 mg capsule XyzaL 5 mg tablet  Results for orders placed or performed during the hospital encounter of 03/24/23 (from the past 48 hour(s))  Surgical pcr screen     Status: None   Collection Time: 03/24/23  9:48 AM   Specimen: Nasal Mucosa; Nasal Swab  Result Value Ref Range   MRSA, PCR NEGATIVE NEGATIVE   Staphylococcus aureus NEGATIVE NEGATIVE    Comment: (NOTE) The Xpert SA Assay (FDA approved for NASAL specimens in patients  95 years of age and older), is one component of a comprehensive surveillance program. It is not intended to diagnose infection nor to guide or monitor treatment. Performed at [facility], [address] [place], [place] [address]    No results found.  Review of Systems  Constitutional: Negative.   HENT: Negative.    Eyes: Negative.   Respiratory: Negative.    Cardiovascular: Negative.   Gastrointestinal: Negative.   Endocrine: Negative.   Genitourinary: Negative.   Musculoskeletal:  Positive for back pain and gait problem.  Neurological:  Positive for weakness and numbness.  Psychiatric/Behavioral: Negative.      There were no vitals taken for this visit. Physical Exam Constitutional:      Appearance: Normal appearance.  HENT:     Head: Normocephalic and atraumatic.     Right Ear: External ear normal.     Left Ear: External ear normal.     Nose: Nose normal.     Mouth/Throat:     Pharynx: Oropharynx is clear.  Eyes:     Conjunctiva/sclera: Conjunctivae normal.  Cardiovascular:     Rate and Rhythm: Normal rate and regular rhythm.     Pulses: Normal pulses.     Heart sounds: Normal heart sounds.  Pulmonary:     Effort: Pulmonary effort is normal.  Abdominal:     General: Bowel sounds are normal.  Musculoskeletal:     Cervical back: Normal range of motion.     Comments: Gait and Station: Appearance: ambulating with no assistive  devices and antalgic gait.  Constitutional: General Appearance: healthy-appearing and distress (mild).  Psychiatric: Mood and Affect: active and alert.  Cardiovascular System: Edema Right: none; Dorsalis and posterior tibial pulses 2+. Edema Left: none.  Abdomen: Inspection and Palpation: non-distended and no tenderness.  Skin: Inspection and palpation: no rash.  Lumbar Spine: Inspection: normal alignment. Bony Palpation of the Lumbar Spine: tender at lumbosacral junction.. Bony Palpation of the Right Hip: no tenderness of the  greater trochanter and tenderness of the SI joint; Pelvis stable. Bony Palpation of the Left Hip: no tenderness of the greater trochanter and tenderness of the SI joint. Soft Tissue Palpation on the Right: No flank pain with percussion. Active Range of Motion: limited flexion and extention.  Motor Strength: L1 Motor Strength on the Right: hip flexion iliopsoas 5/5. L1 Motor Strength on the Left: hip flexion iliopsoas 5/5. L2-L4 Motor Strength on the Right: knee extension quadriceps 5/5. L2-L4 Motor Strength on the Left: knee extension quadriceps 5/5. L5 Motor Strength on the Right: ankle dorsiflexion tibialis anterior 5/5 and great toe extension extensor hallucis longus 5/5. L5 Motor Strength on the Left: ankle dorsiflexion tibialis anterior 5/5 and great toe extension extensor hallucis longus 5/5. S1 Motor Strength on the Right: plantar flexion gastrocnemius 3/5. S1 Motor Strength on the Left: plantar flexion gastrocnemius 5/5.  Neurological System: Knee Reflex Right: normal (2). Knee Reflex Left: normal (2). Ankle Reflex Right: normal (2). Ankle Reflex Left: normal (2). Babinski Reflex Right: plantar reflex absent. Babinski Reflex Left: plantar reflex absent. Sensation on the Right: normal distal extremities. Sensation on the Left: normal distal extremities. Special Tests on the Right: no clonus of the ankle/knee and seated straight leg raising test positive. Special Tests on the Left: no clonus of the ankle/knee.  Skin:    General: Skin is warm and dry.  Neurological:     Mental Status: She is alert.    MRI demonstrates moderate right central foraminal disc herniation with mild to moderate cranial migration impinging upon the right L5 nerve roots. Contact of the L4 nerve root as well. Small central disc herniation L5-S1 with impingement on the left S1 nerve root and displacement of the left S2 nerve root. Severe disc degeneration with Modic changes at L5-S1.  No significant change from her MRI of  February.  Assessment/Plan Impression:  1. 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 No significant interval change in her MRI since previous with continued mass effect upon the L5 nerve root  Plan:  We discussed options at this point in time failing conservative treatment and with her significant pain and neurologic deficits would be wise to consider and microdiscectomy and a lumbar decompression L4-5. I do not feel at this point in time a concomitant fusion is indicated.  She would like to proceed I had an extensive discussion with the patient concerning the pathology relevant anatomy and treatment options. At this point exhausting conservative treatment and in the presence of a neurologic deficit we discussed microlumbar decompression. I discussed the risks and benefits including bleeding, infection, DVT, PE, anesthetic complications, worsening in their symptoms, improvement in their symptoms, C SF leakage, epidural fibrosis, need for future surgeries such as revision discectomy and lumbar fusion. I also indicated that this is an operation to basically decompress the nerve roots to allow recovery as opposed to fixing a herniated disc if it is encountered and that the incidence of recurrent chest disc herniation can approach 15%. Also that nerve  root recovery is variable and may not recover completely. Any ligament or bone that is contributing to compressing the nerves will be removed as well.  I discussed the operative course including overnight in the hospital. Immediate ambulation. Follow-up in 2 weeks for suture removal. 6 weeks until healing of the herniation and surgical incision followed by 6 weeks of reconditioning and strengthening of the core musculature. Also discussed the need to employ the concepts of disc pressure management and core motion following the surgery to minimize the risk of recurrent disc herniation. We  will obtain preoperative clearance i if necessary and proceed accordingly.  No history of DVT or MRSA. For nighttime pain A prescription for an opioid was given to be taken as directed for pain control. I discussed the risks including cognitive changes which may affect the ability to operate machinery and to drive. In addition the side effect of constipation as well as to avoid taking with conflicting medications that were discussed. The patient's prescription drug monitoring report was reviewed with no red flags noted.  We discussed nerve root healing. That it can take a period of time there may be some permanent residual deficits. Also residual pain. 10 to 15% chance of recurrent disc herniation possible need for fusion in the future.  Preoperative clearance.  Plan hemilaminotomy, microdiscectomy L4-5 right  Dorothy Spark, PA-C for Dr Shelle Iron 03/24/2023, 12:42 PM

## 2023-03-24 NOTE — Progress Notes (Addendum)
PCP - Kennon Holter, NP Cardiologist - Pt states that she "sees someone in Cardiology at [facility]" but this is only due to family hx of cardiac issues. Pt does not have any cardiac issues.  PPM/ICD - denies  Chest x-ray - past 10 years per pt, [facility], normal per pt EKG - 03/15/23- [facility]- records requested Stress Test - 5-10 years ago per pt- [facility] ECHO - 11/20/22- [facility] Cardiac Cath - denies  Sleep Study - denies   DM- denies  ASA/Blood Thinner Instructions: n/a   ERAS Protcol - yes PRE-SURGERY Ensure given at PAT   COVID TEST- n/a   Anesthesia review: yes, records requested. Marchelle Folks, at [facility], is sending records of last office note, labs, EKG tracing and ECHO. She said she doesn't see any stress test in her records.  Patient denies shortness of breath, fever, cough and chest pain at PAT appointment   All instructions explained to the patient, with a verbal understanding of the material. Patient agrees to go over the instructions while at home for a better understanding.  The opportunity to ask questions was provided.

## 2023-03-25 NOTE — Progress Notes (Signed)
Anesthesia Chart Review:  57 year old female with pertinent history of HTN, HLD, prediabetes.  She reports she had a previous evaluation with cardiology at [facility] due to family history of CVD.  She reports she had a remote stress test which was normal and more recently had an echocardiogram in February of this year.  Records requested from [facility] medical.  They stated they did not have record of stress test on file. I did receive and review a copy of her echocardiogram that was done on 11/20/2022.  This showed normal LV systolic function with EF 60 to 65%, moderately dilated left atrium, normal RV size and function, normal valvular anatomy and function.  Patient was seen by her PCP Kennon Holter, NP at [facility] on 03/15/2023 for preop evaluation.  Office visit note requested and reviewed, no concerns noted for upcoming procedure.  Patient is on once weekly GLP-1 agonist Ozempic, last dose 03/15/23.  CBC and CMP from 03/15/23 reviewed, unremarkable.  WBC 8.4, hemoglobin 15.1, platelets 260, sodium 143, potassium 4.7, creatinine 0.8.  Full Results on patient chart.  EKG 03/15/2023 (copy on chart): Sinus rhythm.  Rate 84.  Low voltage in precordial leads.  Left atrial enlargement.  Anterior infarct, age undetermined.  TTE 11/20/2022 (copy on chart): Conclusions: 1.  The left ventricle size is normal. 2.  There is mild concentric left ventricular hypertrophy. 3.  Overall left ventricular systolic function is normal with an EF between 60 to 65%. 4.  The left atrium is moderately dilated. 5.  The right ventricle is normal in size and function. 6.  The right atrium is normal in size and function. 7.  Mild mitral annular calcification is present. 8.  Normal valve anatomy and function; no pericardial effusion or intracardiac mass.  Normal thoracic aorta and aortic arch.    Zannie Cove [facility] Short Stay Center/Anesthesiology Phone [phone] 03/25/2023 11:55 AM

## 2023-03-25 NOTE — Anesthesia Preprocedure Evaluation (Addendum)
Anesthesia Evaluation  Patient identified by MRN, date of birth, ID band Patient awake    Reviewed: Allergy & Precautions, H&P , NPO status , Patient's Chart, lab work & pertinent test results, reviewed documented beta blocker date and time   Airway Mallampati: III  TM Distance: >3 FB Neck ROM: Full    Dental  (+) Teeth Intact, Dental Advisory Given   Pulmonary former smoker   Pulmonary exam normal breath sounds clear to auscultation       Cardiovascular hypertension (197/52 preop), Pt. on medications and Pt. on home beta blockers Normal cardiovascular exam Rhythm:Regular Rate:Normal  EKG 03/15/2023 (copy on chart): Sinus rhythm.  Rate 84.  Low voltage in precordial leads.  Left atrial enlargement.  Anterior infarct, age undetermined.  TTE 11/20/2022 (copy on chart): Conclusions: 1.  The left ventricle size is normal. 2.  There is mild concentric left ventricular hypertrophy. 3.  Overall left ventricular systolic function is normal with an EF between 60 to 65%. 4.  The left atrium is moderately dilated. 5.  The right ventricle is normal in size and function. 6.  The right atrium is normal in size and function. 7.  Mild mitral annular calcification is present. 8.  Normal valve anatomy and function; no pericardial effusion or intracardiac mass.  Normal thoracic aorta and aortic arch.  previous evaluation with cardiology at [facility] due to family history of CVD.  She reports she had a remote stress test which was normal and more recently had an echocardiogram in February of this year.     Neuro/Psych negative neurological ROS  negative psych ROS   GI/Hepatic negative GI ROS, Neg liver ROS,,,  Endo/Other  diabetes (prediabetic), Well Controlled  Obesity BMI 39  Renal/GU negative Renal ROS  negative genitourinary   Musculoskeletal negative musculoskeletal ROS (+)    Abdominal  (+) + obese  Peds negative  pediatric ROS (+)  Hematology negative hematology ROS (+) Hb 15   Anesthesia Other Findings Ozempic LD 03/08/23  Reproductive/Obstetrics negative OB ROS                             Anesthesia Physical Anesthesia Plan  ASA: 3  Anesthesia Plan: General   Post-op Pain Management: Dilaudid IV, Ketamine IV* and Ofirmev IV (intra-op)*   Induction: Intravenous  PONV Risk Score and Plan: 3 and Ondansetron, Dexamethasone, Midazolam and Treatment may vary due to age or medical condition  Airway Management Planned: Oral ETT  Additional Equipment: None  Intra-op Plan:   Post-operative Plan: Extubation in OR  Informed Consent: I have reviewed the patients History and Physical, chart, labs and discussed the procedure including the risks, benefits and alternatives for the proposed anesthesia with the patient or authorized representative who has indicated his/her understanding and acceptance.     Dental advisory given  Plan Discussed with: CRNA  Anesthesia Plan Comments: (     )        Anesthesia Quick Evaluation

## 2023-04-01 ENCOUNTER — Encounter (HOSPITAL_COMMUNITY): Payer: Self-pay | Admitting: Specialist

## 2023-04-01 ENCOUNTER — Ambulatory Visit (HOSPITAL_COMMUNITY): Payer: Federal, State, Local not specified - PPO

## 2023-04-01 ENCOUNTER — Ambulatory Visit (HOSPITAL_COMMUNITY)
Admission: RE | Admit: 2023-04-01 | Discharge: 2023-04-02 | Disposition: A | Discharge status: Home / Self Care | Payer: Federal, State, Local not specified - PPO | Attending: Specialist | Admitting: Specialist

## 2023-04-01 ENCOUNTER — Ambulatory Visit (HOSPITAL_COMMUNITY): Payer: Federal, State, Local not specified - PPO | Admitting: Certified Registered Nurse Anesthetist

## 2023-04-01 ENCOUNTER — Ambulatory Visit (HOSPITAL_COMMUNITY): Payer: Federal, State, Local not specified - PPO | Admitting: Physician Assistant

## 2023-04-01 ENCOUNTER — Encounter (HOSPITAL_COMMUNITY)
Admission: RE | Disposition: A | Discharge status: Home / Self Care | Payer: Self-pay | Source: Home / Self Care | Attending: Specialist

## 2023-04-01 ENCOUNTER — Other Ambulatory Visit: Payer: Self-pay

## 2023-04-01 DIAGNOSIS — I10 Essential (primary) hypertension: Secondary | ICD-10-CM | POA: Diagnosis not present

## 2023-04-01 DIAGNOSIS — E119 Type 2 diabetes mellitus without complications: Secondary | ICD-10-CM | POA: Insufficient documentation

## 2023-04-01 DIAGNOSIS — Z87891 Personal history of nicotine dependence: Secondary | ICD-10-CM | POA: Insufficient documentation

## 2023-04-01 DIAGNOSIS — Z7985 Long-term (current) use of injectable non-insulin antidiabetic drugs: Secondary | ICD-10-CM | POA: Diagnosis not present

## 2023-04-01 DIAGNOSIS — M5126 Other intervertebral disc displacement, lumbar region: Secondary | ICD-10-CM | POA: Diagnosis present

## 2023-04-01 DIAGNOSIS — M5116 Intervertebral disc disorders with radiculopathy, lumbar region: Secondary | ICD-10-CM | POA: Insufficient documentation

## 2023-04-01 DIAGNOSIS — E669 Obesity, unspecified: Secondary | ICD-10-CM | POA: Diagnosis not present

## 2023-04-01 DIAGNOSIS — Z79899 Other long term (current) drug therapy: Secondary | ICD-10-CM | POA: Insufficient documentation

## 2023-04-01 DIAGNOSIS — Z6839 Body mass index (BMI) 39.0-39.9, adult: Secondary | ICD-10-CM | POA: Diagnosis not present

## 2023-04-01 HISTORY — PX: LUMBAR LAMINECTOMY/DECOMPRESSION MICRODISCECTOMY: SHX5026

## 2023-04-01 SURGERY — LUMBAR LAMINECTOMY/DECOMPRESSION MICRODISCECTOMY 1 LEVEL
Anesthesia: General | Site: Spine Lumbar | Laterality: Right

## 2023-04-01 MED ORDER — ROCURONIUM BROMIDE 10 MG/ML (PF) SYRINGE
PREFILLED_SYRINGE | INTRAVENOUS | Status: DC | PRN
  Administered 2023-04-01: 100 mg via INTRAVENOUS

## 2023-04-01 MED ORDER — FENTANYL CITRATE (PF) 250 MCG/5ML IJ SOLN
INTRAMUSCULAR | Status: AC
  Filled 2023-04-01: qty 5

## 2023-04-01 MED ORDER — ALUM & MAG HYDROXIDE-SIMETH 200-200-20 MG/5ML PO SUSP: 30.0000 mL | Freq: Four times a day (QID) | ORAL | Status: DC | PRN

## 2023-04-01 MED ORDER — MIDAZOLAM HCL 2 MG/2ML IJ SOLN
INTRAMUSCULAR | Status: AC
  Filled 2023-04-01: qty 2

## 2023-04-01 MED ORDER — KETAMINE HCL 10 MG/ML IJ SOLN
INTRAMUSCULAR | Status: DC | PRN
  Administered 2023-04-01: 10 mg via INTRAVENOUS
  Administered 2023-04-01: 20 mg via INTRAVENOUS

## 2023-04-01 MED ORDER — MAGNESIUM CITRATE PO SOLN: 1.0000 | Freq: Once | ORAL | Status: DC | PRN

## 2023-04-01 MED ORDER — OXYCODONE HCL 5 MG/5ML PO SOLN: 5.0000 mg | Freq: Once | ORAL | Status: AC | PRN

## 2023-04-01 MED ORDER — ONDANSETRON HCL 4 MG/2ML IJ SOLN: 4.0000 mg | Freq: Four times a day (QID) | INTRAMUSCULAR | Status: DC | PRN

## 2023-04-01 MED ORDER — HYDROMORPHONE HCL 1 MG/ML IJ SOLN: 1.0000 mg | INTRAMUSCULAR | Status: DC | PRN

## 2023-04-01 MED ORDER — DEXAMETHASONE SODIUM PHOSPHATE 10 MG/ML IJ SOLN
INTRAMUSCULAR | Status: DC | PRN
  Administered 2023-04-01: 10 mg via INTRAVENOUS

## 2023-04-01 MED ORDER — 0.9 % SODIUM CHLORIDE (POUR BTL) OPTIME
TOPICAL | Status: DC | PRN
  Administered 2023-04-01: 1000 mL

## 2023-04-01 MED ORDER — OXYCODONE HCL 5 MG PO TABS
5.0000 mg | ORAL_TABLET | Freq: Once | ORAL | Status: AC | PRN
  Administered 2023-04-01: 5 mg via ORAL

## 2023-04-01 MED ORDER — PROPOFOL 10 MG/ML IV BOLUS
INTRAVENOUS | Status: AC
  Filled 2023-04-01: qty 20

## 2023-04-01 MED ORDER — ACETAMINOPHEN 10 MG/ML IV SOLN
1000.0000 mg | INTRAVENOUS | Status: AC
  Administered 2023-04-01: 1000 mg via INTRAVENOUS
  Filled 2023-04-01: qty 100

## 2023-04-01 MED ORDER — OXYCODONE HCL 5 MG PO TABS: 5.0000 mg | ORAL_TABLET | ORAL | 0 refills | Status: AC | PRN

## 2023-04-01 MED ORDER — LEVOCETIRIZINE DIHYDROCHLORIDE 5 MG PO TABS: 5.0000 mg | ORAL_TABLET | Freq: Every day | ORAL | Status: DC

## 2023-04-01 MED ORDER — POLYETHYLENE GLYCOL 3350 17 G PO PACK: 17.0000 g | PACK | Freq: Every day | ORAL | 0 refills | Status: AC

## 2023-04-01 MED ORDER — ONDANSETRON HCL 4 MG/2ML IJ SOLN
INTRAMUSCULAR | Status: AC
  Filled 2023-04-01: qty 6

## 2023-04-01 MED ORDER — DOCUSATE SODIUM 100 MG PO CAPS: 100.0000 mg | ORAL_CAPSULE | Freq: Two times a day (BID) | ORAL | 1 refills | Status: AC | PRN

## 2023-04-01 MED ORDER — KETAMINE HCL 50 MG/5ML IJ SOSY
PREFILLED_SYRINGE | INTRAMUSCULAR | Status: AC
  Filled 2023-04-01: qty 5

## 2023-04-01 MED ORDER — LIDOCAINE 2% (20 MG/ML) 5 ML SYRINGE
INTRAMUSCULAR | Status: DC | PRN
  Administered 2023-04-01: 80 mg via INTRAVENOUS

## 2023-04-01 MED ORDER — FENTANYL CITRATE (PF) 250 MCG/5ML IJ SOLN
INTRAMUSCULAR | Status: DC | PRN
  Administered 2023-04-01: 75 ug via INTRAVENOUS
  Administered 2023-04-01: 50 ug via INTRAVENOUS
  Administered 2023-04-01: 25 ug via INTRAVENOUS
  Administered 2023-04-01 (×2): 50 ug via INTRAVENOUS

## 2023-04-01 MED ORDER — KCL IN DEXTROSE-NACL 20-5-0.45 MEQ/L-%-% IV SOLN
INTRAVENOUS | Status: AC
  Filled 2023-04-01: qty 1000

## 2023-04-01 MED ORDER — RISAQUAD PO CAPS
1.0000 | ORAL_CAPSULE | Freq: Every day | ORAL | Status: DC
  Administered 2023-04-01 – 2023-04-02 (×2): 1 via ORAL
  Filled 2023-04-01 (×2): qty 1

## 2023-04-01 MED ORDER — ROCURONIUM BROMIDE 10 MG/ML (PF) SYRINGE
PREFILLED_SYRINGE | INTRAVENOUS | Status: AC
  Filled 2023-04-01: qty 30

## 2023-04-01 MED ORDER — PHENYLEPHRINE HCL-NACL 20-0.9 MG/250ML-% IV SOLN
INTRAVENOUS | Status: DC | PRN
  Administered 2023-04-01: 30 ug/min via INTRAVENOUS

## 2023-04-01 MED ORDER — METHOCARBAMOL 1000 MG/10ML IJ SOLN: 500.0000 mg | Freq: Four times a day (QID) | INTRAVENOUS | Status: DC | PRN

## 2023-04-01 MED ORDER — POLYETHYLENE GLYCOL 3350 17 G PO PACK: 17.0000 g | PACK | Freq: Every day | ORAL | Status: DC | PRN

## 2023-04-01 MED ORDER — LACTATED RINGERS IV SOLN: INTRAVENOUS | Status: DC

## 2023-04-01 MED ORDER — OXYCODONE HCL 5 MG PO TABS: 5.0000 mg | ORAL_TABLET | ORAL | Status: DC | PRN

## 2023-04-01 MED ORDER — ONDANSETRON HCL 4 MG PO TABS: 4.0000 mg | ORAL_TABLET | Freq: Four times a day (QID) | ORAL | Status: DC | PRN

## 2023-04-01 MED ORDER — METHOCARBAMOL 500 MG PO TABS
500.0000 mg | ORAL_TABLET | Freq: Four times a day (QID) | ORAL | Status: DC | PRN
  Administered 2023-04-01 – 2023-04-02 (×3): 500 mg via ORAL
  Filled 2023-04-01 (×3): qty 1

## 2023-04-01 MED ORDER — THROMBIN 20000 UNITS EX SOLR: CUTANEOUS | Status: DC | PRN

## 2023-04-01 MED ORDER — SUGAMMADEX SODIUM 200 MG/2ML IV SOLN
INTRAVENOUS | Status: DC | PRN
  Administered 2023-04-01: 200 mg via INTRAVENOUS

## 2023-04-01 MED ORDER — METHOCARBAMOL 500 MG PO TABS: 500.0000 mg | ORAL_TABLET | Freq: Three times a day (TID) | ORAL | 1 refills | Status: AC | PRN

## 2023-04-01 MED ORDER — PHENYLEPHRINE 80 MCG/ML (10ML) SYRINGE FOR IV PUSH (FOR BLOOD PRESSURE SUPPORT)
PREFILLED_SYRINGE | INTRAVENOUS | Status: AC
  Filled 2023-04-01: qty 10

## 2023-04-01 MED ORDER — DOCUSATE SODIUM 100 MG PO CAPS
100.0000 mg | ORAL_CAPSULE | Freq: Two times a day (BID) | ORAL | Status: DC
  Administered 2023-04-01 – 2023-04-02 (×2): 100 mg via ORAL
  Filled 2023-04-01 (×3): qty 1

## 2023-04-01 MED ORDER — PHENOL 1.4 % MT LIQD: 1.0000 | OROMUCOSAL | Status: DC | PRN

## 2023-04-01 MED ORDER — PROPOFOL 10 MG/ML IV BOLUS
INTRAVENOUS | Status: DC | PRN
  Administered 2023-04-01: 200 mg via INTRAVENOUS

## 2023-04-01 MED ORDER — HYDROMORPHONE HCL 1 MG/ML IJ SOLN
0.2500 mg | INTRAMUSCULAR | Status: DC | PRN
  Administered 2023-04-01 (×3): 0.5 mg via INTRAVENOUS

## 2023-04-01 MED ORDER — TRANEXAMIC ACID-NACL 1000-0.7 MG/100ML-% IV SOLN
1000.0000 mg | INTRAVENOUS | Status: AC
  Administered 2023-04-01: 1000 mg via INTRAVENOUS
  Filled 2023-04-01: qty 100

## 2023-04-01 MED ORDER — OXYCODONE HCL 5 MG PO TABS
10.0000 mg | ORAL_TABLET | ORAL | Status: DC | PRN
  Administered 2023-04-01 – 2023-04-02 (×6): 10 mg via ORAL
  Filled 2023-04-01 (×6): qty 2

## 2023-04-01 MED ORDER — VITAMIN D 25 MCG (1000 UNIT) PO TABS
1000.0000 [IU] | ORAL_TABLET | Freq: Every day | ORAL | Status: DC
  Administered 2023-04-02: 1000 [IU] via ORAL
  Filled 2023-04-01 (×2): qty 1

## 2023-04-01 MED ORDER — HYDROMORPHONE HCL 1 MG/ML IJ SOLN
INTRAMUSCULAR | Status: AC
  Filled 2023-04-01: qty 1

## 2023-04-01 MED ORDER — LOSARTAN POTASSIUM 25 MG PO TABS
25.0000 mg | ORAL_TABLET | Freq: Every day | ORAL | Status: DC
  Administered 2023-04-01: 25 mg via ORAL
  Filled 2023-04-01 (×2): qty 1

## 2023-04-01 MED ORDER — THROMBIN 20000 UNITS EX SOLR
CUTANEOUS | Status: AC
  Filled 2023-04-01: qty 20000

## 2023-04-01 MED ORDER — ACETAMINOPHEN 650 MG RE SUPP: 650.0000 mg | RECTAL | Status: DC | PRN

## 2023-04-01 MED ORDER — DEXAMETHASONE SODIUM PHOSPHATE 10 MG/ML IJ SOLN
INTRAMUSCULAR | Status: AC
  Filled 2023-04-01: qty 3

## 2023-04-01 MED ORDER — ACETAMINOPHEN 325 MG PO TABS: 650.0000 mg | ORAL_TABLET | ORAL | Status: DC | PRN

## 2023-04-01 MED ORDER — CEFAZOLIN SODIUM-DEXTROSE 2-4 GM/100ML-% IV SOLN
2.0000 g | INTRAVENOUS | Status: AC
  Administered 2023-04-01: 2 g via INTRAVENOUS
  Filled 2023-04-01: qty 100

## 2023-04-01 MED ORDER — CHLORHEXIDINE GLUCONATE 0.12 % MT SOLN
OROMUCOSAL | Status: AC
  Administered 2023-04-01: 15 mL via OROMUCOSAL
  Filled 2023-04-01: qty 15

## 2023-04-01 MED ORDER — BISACODYL 5 MG PO TBEC: 5.0000 mg | DELAYED_RELEASE_TABLET | Freq: Every day | ORAL | Status: DC | PRN

## 2023-04-01 MED ORDER — MENTHOL 3 MG MT LOZG: 1.0000 | LOZENGE | OROMUCOSAL | Status: DC | PRN

## 2023-04-01 MED ORDER — ONDANSETRON HCL 4 MG/2ML IJ SOLN: 4.0000 mg | Freq: Once | INTRAMUSCULAR | Status: DC | PRN

## 2023-04-01 MED ORDER — CEFAZOLIN SODIUM-DEXTROSE 2-4 GM/100ML-% IV SOLN
2.0000 g | Freq: Three times a day (TID) | INTRAVENOUS | Status: AC
  Administered 2023-04-01 (×2): 2 g via INTRAVENOUS
  Filled 2023-04-01 (×2): qty 100

## 2023-04-01 MED ORDER — BUPIVACAINE-EPINEPHRINE (PF) 0.25% -1:200000 IJ SOLN
INTRAMUSCULAR | Status: AC
  Filled 2023-04-01: qty 30

## 2023-04-01 MED ORDER — LORATADINE 10 MG PO TABS
10.0000 mg | ORAL_TABLET | Freq: Every day | ORAL | Status: DC
  Administered 2023-04-01: 10 mg via ORAL
  Filled 2023-04-01: qty 1

## 2023-04-01 MED ORDER — CHLORHEXIDINE GLUCONATE 0.12 % MT SOLN
15.0000 mL | OROMUCOSAL | Status: AC
  Filled 2023-04-01: qty 15

## 2023-04-01 MED ORDER — QUERCETIN 50 MG PO TABS: ORAL_TABLET | Freq: Every day | ORAL | Status: DC

## 2023-04-01 MED ORDER — AMISULPRIDE (ANTIEMETIC) 5 MG/2ML IV SOLN: 10.0000 mg | Freq: Once | INTRAVENOUS | Status: DC | PRN

## 2023-04-01 MED ORDER — EPHEDRINE 5 MG/ML INJ
INTRAVENOUS | Status: AC
  Filled 2023-04-01: qty 5

## 2023-04-01 MED ORDER — NEBIVOLOL HCL 10 MG PO TABS
10.0000 mg | ORAL_TABLET | Freq: Every evening | ORAL | Status: DC
  Filled 2023-04-01: qty 1

## 2023-04-01 MED ORDER — KETOROLAC TROMETHAMINE 30 MG/ML IJ SOLN: 30.0000 mg | Freq: Once | INTRAMUSCULAR | Status: DC | PRN

## 2023-04-01 MED ORDER — MIDAZOLAM HCL 2 MG/2ML IJ SOLN
INTRAMUSCULAR | Status: DC | PRN
  Administered 2023-04-01: 2 mg via INTRAVENOUS

## 2023-04-01 MED ORDER — LIDOCAINE 2% (20 MG/ML) 5 ML SYRINGE
INTRAMUSCULAR | Status: AC
  Filled 2023-04-01: qty 15

## 2023-04-01 MED ORDER — MEPERIDINE HCL 25 MG/ML IJ SOLN: 6.2500 mg | INTRAMUSCULAR | Status: DC | PRN

## 2023-04-01 MED ORDER — ACETAMINOPHEN 500 MG PO TABS: 1000.0000 mg | ORAL_TABLET | Freq: Once | ORAL | Status: DC

## 2023-04-01 MED ORDER — OXYCODONE HCL 5 MG PO TABS
ORAL_TABLET | ORAL | Status: AC
  Filled 2023-04-01: qty 1

## 2023-04-01 MED ORDER — BUPIVACAINE-EPINEPHRINE 0.5% -1:200000 IJ SOLN
INTRAMUSCULAR | Status: DC | PRN
  Administered 2023-04-01: 5 mL

## 2023-04-01 MED ORDER — GABAPENTIN 300 MG PO CAPS
300.0000 mg | ORAL_CAPSULE | Freq: Every day | ORAL | Status: DC
  Administered 2023-04-01: 300 mg via ORAL
  Filled 2023-04-01: qty 1

## 2023-04-01 MED ORDER — ONDANSETRON HCL 4 MG/2ML IJ SOLN
INTRAMUSCULAR | Status: DC | PRN
  Administered 2023-04-01: 4 mg via INTRAVENOUS

## 2023-04-01 SURGICAL SUPPLY — 60 items
BAG COUNTER SPONGE SURGICOUNT (BAG) ×1 IMPLANT
BAG DECANTER FOR FLEXI CONT (MISCELLANEOUS) IMPLANT
BAG SPNG CNTER NS LX DISP (BAG) ×1
BAND INSRT 18 STRL LF DISP RB (MISCELLANEOUS) ×2
BAND RUBBER #18 3X1/16 STRL (MISCELLANEOUS) ×2 IMPLANT
BUR EGG ELITE 5.0 (BURR) IMPLANT
BUR RND DIAMOND ELITE 4.0 (BURR) IMPLANT
CLEANER TIP ELECTROSURG 2X2 (MISCELLANEOUS) ×1 IMPLANT
CNTNR URN SCR LID CUP LEK RST (MISCELLANEOUS) ×1 IMPLANT
CONT SPEC 4OZ STRL OR WHT (MISCELLANEOUS) ×1
DRAPE LAPAROTOMY 100X72X124 (DRAPES) ×1 IMPLANT
DRAPE MICROSCOPE SLANT 54X150 (MISCELLANEOUS) ×1 IMPLANT
DRAPE SHEET LG 3/4 BI-LAMINATE (DRAPES) ×1 IMPLANT
DRAPE SURG 17X11 SM STRL (DRAPES) ×1 IMPLANT
DRAPE UTILITY XL STRL (DRAPES) ×1 IMPLANT
DRSG AQUACEL AG ADV 3.5X 4 (GAUZE/BANDAGES/DRESSINGS) IMPLANT
DRSG AQUACEL AG ADV 3.5X 6 (GAUZE/BANDAGES/DRESSINGS) IMPLANT
DRSG TELFA 3X8 NADH STRL (GAUZE/BANDAGES/DRESSINGS) IMPLANT
DURAPREP 26ML APPLICATOR (WOUND CARE) ×1 IMPLANT
DURASEAL SPINE SEALANT 3ML (MISCELLANEOUS) IMPLANT
ELECT BLADE 4.0 EZ CLEAN MEGAD (MISCELLANEOUS)
ELECT REM PT RETURN 9FT ADLT (ELECTROSURGICAL) ×1
ELECTRODE BLDE 4.0 EZ CLN MEGD (MISCELLANEOUS) IMPLANT
ELECTRODE REM PT RTRN 9FT ADLT (ELECTROSURGICAL) ×1 IMPLANT
GLOVE BIOGEL PI IND STRL 7.5 (GLOVE) ×1 IMPLANT
GLOVE SURG SS PI 7.0 STRL IVOR (GLOVE) ×1 IMPLANT
GLOVE SURG SS PI 7.5 STRL IVOR (GLOVE) IMPLANT
GLOVE SURG SS PI 8.0 STRL IVOR (GLOVE) ×2 IMPLANT
GOWN STRL REUS W/ TWL LRG LVL3 (GOWN DISPOSABLE) ×1 IMPLANT
GOWN STRL REUS W/ TWL XL LVL3 (GOWN DISPOSABLE) ×1 IMPLANT
GOWN STRL REUS W/TWL LRG LVL3 (GOWN DISPOSABLE) ×5
GOWN STRL REUS W/TWL XL LVL3 (GOWN DISPOSABLE) ×1
IV CATH 14GX2 1/4 (CATHETERS) ×1 IMPLANT
KIT BASIN OR (CUSTOM PROCEDURE TRAY) ×1 IMPLANT
NDL 22X1.5 STRL (OR ONLY) (MISCELLANEOUS) ×1 IMPLANT
NDL SPNL 18GX3.5 QUINCKE PK (NEEDLE) ×2 IMPLANT
NEEDLE 22X1.5 STRL (OR ONLY) (MISCELLANEOUS) ×1 IMPLANT
NEEDLE SPNL 18GX3.5 QUINCKE PK (NEEDLE) ×2 IMPLANT
PACK LAMINECTOMY NEURO (CUSTOM PROCEDURE TRAY) ×1 IMPLANT
PATTIES SURGICAL .25X.25 (GAUZE/BANDAGES/DRESSINGS) IMPLANT
PATTIES SURGICAL .75X.75 (GAUZE/BANDAGES/DRESSINGS) ×1 IMPLANT
SOLUTION PRONTOSAN WOUND 350ML (IRRIGATION / IRRIGATOR) IMPLANT
SPONGE SURGIFOAM ABS GEL 100 (HEMOSTASIS) ×1 IMPLANT
SPONGE T-LAP 4X18 ~~LOC~~+RFID (SPONGE) IMPLANT
STAPLER VISISTAT (STAPLE) IMPLANT
STRIP CLOSURE SKIN 1/2X4 (GAUZE/BANDAGES/DRESSINGS) ×1 IMPLANT
SUT NURALON 4 0 TR CR/8 (SUTURE) IMPLANT
SUT PROLENE 3 0 PS 2 (SUTURE) IMPLANT
SUT VIC AB 1 CT1 27 (SUTURE) ×2
SUT VIC AB 1 CT1 27XBRD ANTBC (SUTURE) IMPLANT
SUT VIC AB 1-0 CT2 27 (SUTURE) IMPLANT
SUT VIC AB 2-0 CT1 27 (SUTURE)
SUT VIC AB 2-0 CT1 TAPERPNT 27 (SUTURE) IMPLANT
SUT VIC AB 2-0 CT2 27 (SUTURE) IMPLANT
SYR 3ML LL SCALE MARK (SYRINGE) ×1 IMPLANT
TOWEL GREEN STERILE (TOWEL DISPOSABLE) ×1 IMPLANT
TOWEL GREEN STERILE FF (TOWEL DISPOSABLE) ×1 IMPLANT
TRAY FOLEY MTR SLVR 16FR STAT (SET/KITS/TRAYS/PACK) ×1 IMPLANT
WIPE CHG 2% 2PK PREOPERATIVE (MISCELLANEOUS) ×1 IMPLANT
YANKAUER SUCT BULB TIP NO VENT (SUCTIONS) ×1 IMPLANT

## 2023-04-01 NOTE — Anesthesia Postprocedure Evaluation (Signed)
Anesthesia Post Note  Patient: Therapist, nutritional  Procedure(s) Performed: Hemi Laminotomy Microdiscectomy Lumbar Four-Lumbar Five Right (Right: Spine Lumbar)     Patient location during evaluation: PACU Anesthesia Type: General Level of consciousness: awake and alert, oriented and patient cooperative Pain management: pain level controlled Vital Signs Assessment: post-procedure vital signs reviewed and stable Respiratory status: spontaneous breathing, nonlabored ventilation and respiratory function stable Cardiovascular status: blood pressure returned to baseline and stable Postop Assessment: no apparent nausea or vomiting Anesthetic complications: no   No notable events documented.  Last Vitals:  Vitals:   04/01/23 1100 04/01/23 1121  BP: (!) 142/95 138/69  Pulse: 76 78  Resp: 14 18  Temp:  36.6 C  SpO2: 98% 100%    Last Pain:  Vitals:   04/01/23 1121  TempSrc: Oral  PainSc:                  Lannie Fields

## 2023-04-01 NOTE — Evaluation (Signed)
Occupational Therapy Evaluation and DC Summary  Patient Details Name: Sonya Watson MRN: [id number] DOB: 1966-08-18 Today's Date: 04/01/2023   History of Present Illness 57 y.o. female presenting for surgery with a dx of herniated disc, stenosis L4-L5 right with pain radiating down RLE. Pt is now s/p Hemi laminotomy microdiscectomy L4-5 right (Right) 04/01/23. PMH of Arthritis, HTN, pre-diabetes   Clinical Impression   Pt admitted for above dx, PTA patient lived with spouse and reports furniture walking at home and having husband assist with bADLs/iADLs. Pt currently managing POB pain very well and ambulating with Supervision no AD, but does have a waddling gait pattern. Educated pt on POB precautions, she recalls 3/3 of them and demonstrated good use of precautions to complete functional transfers and bADLs. Educated pt on heel cord stretch with use of towel/bands for R ankle ROM as she had increased difficulty with moving it as of recent. Pt has no further acute skilled OT needs, no follow-up OT recommended.      Recommendations for follow up therapy are one component of a multi-disciplinary discharge planning process, led by the attending physician.  Recommendations may be updated based on patient status, additional functional criteria and insurance authorization.   Assistance Recommended at Discharge PRN  Patient can return home with the following Assistance with cooking/housework    Functional Status Assessment  Patient has not had a recent decline in their functional status  Equipment Recommendations  None recommended by OT (Pt has rec DME needs)       Precautions / Restrictions Precautions Precautions: Back Precaution Booklet Issued: Yes (comment) Precaution Comments: Pt recalls 3/3 POB precautions Restrictions Weight Bearing Restrictions: No      Mobility Bed Mobility Overal bed mobility: Modified Independent             General bed mobility comments: Pt demonstrating  good use of log roll and car transfers    Transfers Overall transfer level: Needs assistance   Transfers: Sit to/from Stand Sit to Stand: Supervision                  Balance Overall balance assessment: Needs assistance Sitting-balance support: Feet supported Sitting balance-Leahy Scale: Good     Standing balance support: During functional activity Standing balance-Leahy Scale: Fair Standing balance comment: Waddling gait but does not need UE support                           ADL either performed or assessed with clinical judgement   ADL Overall ADL's : Needs assistance/impaired Eating/Feeding: Independent;Sitting   Grooming: Standing;Supervision/safety   Upper Body Bathing: Independent;Sitting   Lower Body Bathing: Sitting/lateral leans;Modified independent   Upper Body Dressing : Independent;Sitting   Lower Body Dressing: Sitting/lateral leans;Modified independent Lower Body Dressing Details (indicate cue type and reason): Pt demonstrated figure four technique to complete LBD with BLEs Toilet Transfer: Supervision/safety;Ambulation   Toileting- Clothing Manipulation and Hygiene: Supervision/safety;Sitting/lateral lean   Tub/ Shower Transfer: Supervision/safety   Functional mobility during ADLs: Supervision/safety General ADL Comments: Hall ambulation ~118ft no AD     Vision         Perception     Praxis      Pertinent Vitals/Pain Pain Assessment Pain Assessment: 0-10 Pain Score: 2  Pain Location: Back Pain Descriptors / Indicators: Aching, Sore Pain Intervention(s): Repositioned, Monitored during session     Hand Dominance     Extremity/Trunk Assessment Upper Extremity Assessment Upper Extremity Assessment: Overall WFL for  tasks assessed   Lower Extremity Assessment Lower Extremity Assessment: Defer to PT evaluation (tingling and light sensation to R foot)   Cervical / Trunk Assessment Cervical / Trunk Assessment: Back  Surgery   Communication Communication Communication: No difficulties   Cognition Arousal/Alertness: Awake/alert Behavior During Therapy: WFL for tasks assessed/performed Overall Cognitive Status: Within Functional Limits for tasks assessed                                       General Comments  VSS on RA    Exercises     Shoulder Instructions      Home Living Family/patient expects to be discharged to:: Private residence Living Arrangements: Spouse/significant other Available Help at Discharge: Family;Available PRN/intermittently (husband will be off to provide 24/7 care during first week of DC) Type of Home: House Home Access: Ramped entrance     Home Layout: Two level;Able to live on main level with bedroom/bathroom     Bathroom Shower/Tub: Producer, television/film/video: Standard Bathroom Accessibility: Yes   Home Equipment: Shower seat - built in          Prior Functioning/Environment Prior Level of Function : Needs assist             Mobility Comments: Pt reports furniture walking back home ADLs Comments: asisst from husband for bADLs        OT Problem List: Pain;Impaired balance (sitting and/or standing)      OT Treatment/Interventions:      OT Goals(Current goals can be found in the care plan section) Acute Rehab OT Goals Patient Stated Goal: To go home OT Goal Formulation: With patient/family Time For Goal Achievement: 04/15/23 Potential to Achieve Goals: Good  OT Frequency:      Co-evaluation              AM-PAC OT "6 Clicks" Daily Activity     Outcome Measure Help from another person eating meals?: None Help from another person taking care of personal grooming?: A Little Help from another person toileting, which includes using toliet, bedpan, or urinal?: A Little Help from another person bathing (including washing, rinsing, drying)?: None Help from another person to put on and taking off regular upper body  clothing?: None Help from another person to put on and taking off regular lower body clothing?: None 6 Click Score: 22   End of Session Equipment Utilized During Treatment: Gait belt Nurse Communication: Mobility status  Activity Tolerance: Patient tolerated treatment well Patient left: in bed;with call bell/phone within reach  OT Visit Diagnosis: Unsteadiness on feet (R26.81);Pain Pain - part of body:  (back)                Time: 1610-9604 OT Time Calculation (min): 24 min Charges:  OT General Charges $OT Visit: 1 Visit OT Evaluation $OT Eval Moderate Complexity: 1 Mod OT Treatments $Therapeutic Activity: 8-22 mins  04/01/2023  AB, OTR/L  Acute Rehabilitation Services  Office: [phone]   Tristan Schroeder 04/01/2023, 6:12 PM

## 2023-04-01 NOTE — Transfer of Care (Signed)
Immediate Anesthesia Transfer of Care Note  Patient: Sonya Watson  Procedure(s) Performed: Hemi Laminotomy Microdiscectomy Lumbar Four-Lumbar Five Right (Right: Spine Lumbar)  Patient Location: PACU  Anesthesia Type:General  Level of Consciousness: drowsy and patient cooperative  Airway & Oxygen Therapy: Patient Spontanous Breathing  Post-op Assessment: Report given to RN, Post -op Vital signs reviewed and stable, and Patient moving all extremities X 4  Post vital signs: Reviewed and stable  Last Vitals:  Vitals Value Taken Time  BP 150/75 04/01/23 1009  Temp    Pulse 86 04/01/23 1010  Resp 15 04/01/23 1010  SpO2 93 % 04/01/23 1010  Vitals shown include unvalidated device data.  Last Pain:  Vitals:   04/01/23 0628  TempSrc:   PainSc: 2       Patients Stated Pain Goal: 0 (04/01/23 2956)  Complications: No notable events documented.

## 2023-04-01 NOTE — Op Note (Signed)
NAMEADILENNE, Sonya Watson MEDICAL RECORD NO: [id number] ACCOUNT NO: [id number] DATE OF BIRTH: May 04, 1966 FACILITY: [facility] LOCATION: [facility] PHYSICIAN: Javier Docker, MD  Operative Report   DATE OF PROCEDURE: 04/01/2023  PREOPERATIVE DIAGNOSIS: 1.  Spinal stenosis, HNP, L4-L5, right. 2.  Elevated BMI 39.3.  POSTOPERATIVE DIAGNOSES: 1.  Spinal stenosis, HNP, L4-L5, right. 2 .  Elevated BMI 39.3.  PROCEDURE PERFORMED:   1.  Hemilaminotomy with foraminotomy of L5. 2.  Microdiskectomy L4-L5, right.  Technical difficulty increased due to the patient's elevated BMI.  HISTORY:  [name] with L5 radiculopathy secondary to large disk herniation at L4-L5 with associated disk degeneration and lateral recess stenosis, impingement of the L5 nerve root.  She had neural tension signs, EHL weakness.  She was indicated for  decompression of the L5 nerve root by microdiskectomy and hemilaminotomy.  Risks and benefits discussed including bleeding, infection, damage to neurovascular structures, no change in symptoms, worsening symptoms, DVT, PE, anesthetic complications, etc.  DESCRIPTION OF PROCEDURE:  The patient in supine position, after induction of adequate anesthesia, 2 grams Kefzol, she was placed prone on the Wilson frame.  All bony prominences well padded.  Lumbar region was prepped and draped in the usual sterile  fashion.  Two 18-gauge spinal needles utilized to localize the L4-L5 interspace, confirmed with x-ray.  Incision was made from the spinous process of L4-L5.  Subcutaneous tissue was dissected. Electrocautery utilized to achieve hemostasis.  Cerebellars  were used to identify the fascia with a subcutaneous adipose layer extended.  The dorsal lumbar fascia divided in line with skin incision.  Paraspinous muscle elevated from lamina 4-5.  The extra-long [facility] retractors were utilized.  A Penfield 4 was  placed in the interlaminar space.  Confirmatory radiograph obtained.  This  identified the disk space.  High-speed bur was utilized to remove a portion of the inferior aspect of L4 for laminotomy.  I identified the facet, which was hypertrophic.  I used  a straight curette to detach ligamentum flavum from the cephalad edge of 5.  I then began removing ligamentum flavum from the interspace and completed the hemilaminotomy at caudad edge of 4 with a 3 mm Kerrison.  I identified the lateral recess.  I used  a straight curette to detach ligamentum flavum from the cephalad edge of 5 and the superior articulating process of 5.  I used a Penfield to enter the epidural space, protected the neural elements.  I performed a foraminotomy of L5.  Decompressed the  lateral recess to the medial border of the pedicle.  Following this, I used a neuro patty to protect the neural elements.  Removed the patty beneath the ligamentum flavum and removed ligamentum flavum from the interspace.  Severe compression of the L5  nerve root was noted by a large disk herniation.  Nerve root was flattened.  I performed a generous foraminotomy of L5.  I gently gained access to the lateral aspect of the disk herniation.  I gently mobilized the 5 root medially with Penfield.  I  performed the annulotomy with a 15 blade, again the microscope had been draped and brought in the surgical field.  I then used a nerve hook to remove two large fragments laterally, disk material.  This enabled me to gently mobilize the L5 nerve root more  medially.  Exposing the entire HNP.  I performed the cruciform incision in the annulus.  Copious portion of disk material was removed from the disk space with the micropituitary upbiting with micropituitary  further mobilizing with a nerve hook, Woodson  and catheter lavage.  Multiple fragments were excised.  Following the diskectomy I had removed all of the herniated material.  A confirmatory radiograph obtained.  I checked beneath thecal sac the axilla of the 5 root, shoulder, foramen of 4  and 5, no  residual disk herniation material was noted.  There was 1 cm of excursion of the 5 root medial to pedicle without tension.  No evidence of CSF leakage or active bleeding.  The nerve root was erythematous and edematous.  It was intact.  Bone wax was  placed on the cancellous surfaces.  We had used bipolar electrocautery to achieve hemostasis of an epidural venous plexus, which was tethering the 5 root, it was cauterized and divided.  Next copiously irrigated laminotomy in the wound.  Again, no  evidence of CSF leakage or active bleeding.  I removed the McCulloch retractor after obtaining a confirmatory radiograph at 4-5.  I used bipolar cautery to achieve hemostasis in the paraspinous musculature.  Copiously irrigated that as well.  Thrombin-soaked Gelfoam was placed in the laminotomy defect and then removed.  I closed the dorsal lumbar fascia with #1 Vicryl in interrupted figure-of-eight suture, subcutaneous with 2-0 and skin with subcuticular Prolene.  Sterile dressing applied,  placed supine on the hospital bed, extubated without difficulty and transported to the recovery room in satisfactory condition.  The patient tolerated the procedure well.  No complications.  ASSISTANT:  Andrez Grime, PA, was used throughout the case for patient positioning, general intermittent neural retraction, closure.  BLOOD LOSS:  20 mL.   PUS D: 04/01/2023 10:00:55 am T: 04/01/2023 10:41:00 am  JOB: [id number]/ [id number]

## 2023-04-01 NOTE — Brief Op Note (Signed)
04/01/2023  7:11 AM  PATIENT:  Sonya Watson  57 y.o. female  PRE-OPERATIVE DIAGNOSIS:  Herniated disc, stenosis L4-5 right  POST-OPERATIVE DIAGNOSIS:  * No post-op diagnosis entered *  PROCEDURE:  Procedure(s): Hemi laminotomy microdiscectomy L4-5 right (Right)  SURGEON:  Surgeon(s) and Role:    Jene Every, MD - Primary  PHYSICIAN ASSISTANT:   ASSISTANTS: Bissell   ANESTHESIA:   general  EBL:  20   BLOOD ADMINISTERED:none  DRAINS: none   LOCAL MEDICATIONS USED:  MARCAINE     SPECIMEN:  No Specimen  DISPOSITION OF SPECIMEN:  N/A  COUNTS:  YES  TOURNIQUET:  * No tourniquets in log *  DICTATION: .Other Dictation: Dictation Number [id number]   PLAN OF CARE: Admit for overnight observation  PATIENT DISPOSITION:  PACU - hemodynamically stable.   Delay start of Pharmacological VTE agent (>24hrs) due to surgical blood loss or risk of bleeding: yes

## 2023-04-01 NOTE — Plan of Care (Signed)

## 2023-04-01 NOTE — Discharge Instructions (Signed)

## 2023-04-01 NOTE — Anesthesia Procedure Notes (Signed)
Procedure Name: Intubation Date/Time: 04/01/2023 7:39 AM  Performed by: Alease Medina, CRNAPre-anesthesia Checklist: Patient identified, Emergency Drugs available, Suction available and Patient being monitored Patient Re-evaluated:Patient Re-evaluated prior to induction Oxygen Delivery Method: Circle system utilized Preoxygenation: Pre-oxygenation with 100% oxygen Induction Type: IV induction Ventilation: Mask ventilation without difficulty Laryngoscope Size: Mac and 3 Grade View: Grade I Tube type: Oral Tube size: 7.5 mm Number of attempts: 1 Airway Equipment and Method: Stylet and Oral airway Placement Confirmation: ETT inserted through vocal cords under direct vision, positive ETCO2 and breath sounds checked- equal and bilateral Secured at: 22 cm Tube secured with: Tape Dental Injury: Teeth and Oropharynx as per pre-operative assessment

## 2023-04-01 NOTE — Interval H&P Note (Signed)
History and Physical Interval Note:  04/01/2023 7:11 AM  Sonya Watson  has presented today for surgery, with the diagnosis of Herniated disc, stenosis L4-5 right.  The various methods of treatment have been discussed with the patient and family. After consideration of risks, benefits and other options for treatment, the patient has consented to  Procedure(s): Hemi laminotomy microdiscectomy L4-5 right (Right) as a surgical intervention.  The patient's history has been reviewed, patient examined, no change in status, stable for surgery.  I have reviewed the patient's chart and labs.  Questions were answered to the patient's satisfaction.     Javier Docker

## 2023-04-02 ENCOUNTER — Encounter (HOSPITAL_COMMUNITY): Payer: Self-pay | Admitting: Specialist

## 2023-04-02 DIAGNOSIS — M5116 Intervertebral disc disorders with radiculopathy, lumbar region: Secondary | ICD-10-CM | POA: Diagnosis not present

## 2023-04-02 LAB — BASIC METABOLIC PANEL WITH GFR
Anion gap: 13 (ref 5–15)
BUN: 10 mg/dL (ref 6–20)
CO2: 27 mmol/L (ref 22–32)
Calcium: 9.3 mg/dL (ref 8.9–10.3)
Chloride: 100 mmol/L (ref 98–111)
Creatinine, Ser: 0.84 mg/dL (ref 0.44–1.00)
GFR, Estimated: >60 mL/min
Glucose, Bld: 167 mg/dL — ABNORMAL HIGH (ref 70–99)
Potassium: 4 mmol/L (ref 3.5–5.1)
Sodium: 140 mmol/L (ref 135–145)

## 2023-04-02 NOTE — Discharge Summary (Signed)
Physician Discharge Summary   Patient ID: Sonya Watson MRN: [id number] DOB/AGE: 03-02-1966 57 y.o.  Admit date: 04/01/2023 Discharge date: 04/02/2023  Primary Diagnosis:   Herniated Disc, Stenosis Lumbar Four- Lumbar Five Right  Admission Diagnoses:  Past Medical History:  Diagnosis Date   Arthritis    High cholesterol    Hypertension    on bystolic-diagnosed htn 1 year ago   Pre-diabetes    Seasonal allergies    Discharge Diagnoses:   Principal Problem:   HNP (herniated nucleus pulposus), lumbar  Procedure:  Procedure(s) (LRB): Hemi Laminotomy Microdiscectomy Lumbar Four-Lumbar Five Right (Right)   Consults: None  HPI:  See H&P    Laboratory Data: Hospital Outpatient Visit on 03/24/2023  Component Date Value Ref Range Status   MRSA, PCR 03/24/2023 NEGATIVE  NEGATIVE Final   Staphylococcus aureus 03/24/2023 NEGATIVE  NEGATIVE Final   Comment: (NOTE) The Xpert SA Assay (FDA approved for NASAL specimens in patients 55 years of age and older), is one component of a comprehensive surveillance program. It is not intended to diagnose infection nor to guide or monitor treatment. Performed at [facility], [address], [place], [place] [address]    No results for input(s): "HGB" in the last 72 hours. No results for input(s): "WBC", "RBC", "HCT", "PLT" in the last 72 hours. No results for input(s): "NA", "K", "CL", "CO2", "BUN", "CREATININE", "GLUCOSE", "CALCIUM" in the last 72 hours. No results for input(s): "LABPT", "INR" in the last 72 hours.  X-Rays:DG Lumbar Spine 2-3 Views  Result Date: 04/01/2023 CLINICAL DATA:  Elective surgery EXAM: LUMBAR SPINE - 2-3 VIEW COMPARISON:  Radiograph 03/24/2023 FINDINGS: Three portable cross-table lateral views of the lumbar spine obtained in the operating room. Film 1 demonstrates surgical instruments posteriorly at the L5 and S1 level. Film 2 demonstrates surgical instruments projecting posteriorly over the L5 spinous  process. Film 3 demonstrates surgical instruments posteriorly at the L5 level. IMPRESSION: Intraoperative localization. Electronically Signed   By: Narda Rutherford M.D.   On: 04/01/2023 12:32   DG Lumbar Spine 2-3 Views  Result Date: 03/28/2023 CLINICAL DATA:  Herniated nucleus pulosus, lumbar. Preop for lumbar surgery. EXAM: LUMBAR SPINE - 2-3 VIEW COMPARISON:  None Available. FINDINGS: Five non-rib-bearing lumbar vertebra. Dextroscoliotic curvature of the lower thoracic and upper lumbar spine. Disc space narrowing and spurring at L4-L5 and L5-S1. Mild lower lumbar facet hypertrophy. No evidence of fracture or focal bone abnormality. IMPRESSION: 1. Five non-rib-bearing lumbar vertebra 2. Dextroscoliotic curvature of the lower thoracic and upper lumbar spine. 3. Degenerative disc disease at L4-L5 and L5-S1. Lower lumbar facet hypertrophy Electronically Signed   By: Narda Rutherford M.D.   On: 03/28/2023 16:37    EKG: Orders placed or performed during the hospital encounter of 05/13/11   EKG   EKG     Hospital Course: Patient was admitted to [facility] and taken to the OR and underwent the above state procedure without complications.  Patient tolerated the procedure well and was later transferred to the recovery room and then to the orthopaedic floor for postoperative care.  They were given PO and IV analgesics for pain control following their surgery.  They were given 24 hours of postoperative antibiotics.   PT was consulted postop to assist with mobility and transfers.  The patient was allowed to be WBAT with therapy and was taught back precautions. Discharge planning was consulted to help with postop disposition and equipment needs.  Patient had a good night on the evening  of surgery and started to get up OOB with therapy on day one. Patient was seen in rounds and was ready to go home on day one.  They were given discharge instructions and dressing directions.  They were instructed on when  to follow up in the office with Dr. Shelle Iron.   Diet: Regular diet Activity:WBAT Follow-up:in 10-14 days Disposition - Home Discharged Condition: good   Discharge Instructions     Call MD / Call 911   Complete by: As directed    If you experience chest pain or shortness of breath, CALL 911 and be transported to the hospital emergency room.  If you develope a fever above 101 F, pus (white drainage) or increased drainage or redness at the wound, or calf pain, call your surgeon's office.   Constipation Prevention   Complete by: As directed    Drink plenty of fluids.  Prune juice may be helpful.  You may use a stool softener, such as Colace (over the counter) 100 mg twice a day.  Use MiraLax (over the counter) for constipation as needed.   Diet - low sodium heart healthy   Complete by: As directed    Incentive spirometry RT   Complete by: As directed    Increase activity slowly as tolerated   Complete by: As directed    Post-operative opioid taper instructions:   Complete by: As directed    POST-OPERATIVE OPIOID TAPER INSTRUCTIONS: It is important to wean off of your opioid medication as soon as possible. If you do not need pain medication after your surgery it is ok to stop day one. Opioids include: Codeine, Hydrocodone(Norco, Vicodin), Oxycodone(Percocet, oxycontin) and hydromorphone amongst others.  Long term and even short term use of opiods can cause: Increased pain response Dependence Constipation Depression Respiratory depression And more.  Withdrawal symptoms can include Flu like symptoms Nausea, vomiting And more Techniques to manage these symptoms Hydrate well Eat regular healthy meals Stay active Use relaxation techniques(deep breathing, meditating, yoga) Do Not substitute Alcohol to help with tapering If you have been on opioids for less than two weeks and do not have pain than it is ok to stop all together.  Plan to wean off of opioids This plan should start  within one week post op of your joint replacement. Maintain the same interval or time between taking each dose and first decrease the dose.  Cut the total daily intake of opioids by one tablet each day Next start to increase the time between doses. The last dose that should be eliminated is the evening dose.         Allergies as of 04/02/2023       Reactions   Erythromycin Other (See Comments)   Stomach cramps        Medication List     STOP taking these medications    atorvastatin 20 MG tablet Commonly known as: LIPITOR   cyclobenzaprine 10 MG tablet Commonly known as: FLEXERIL   meloxicam 15 MG tablet Commonly known as: MOBIC   oxyCODONE-acetaminophen 5-325 MG tablet Commonly known as: PERCOCET/ROXICET   predniSONE 5 MG tablet Commonly known as: DELTASONE       TAKE these medications    docusate sodium 100 MG capsule Commonly known as: Colace Take 1 capsule (100 mg total) by mouth 2 (two) times daily as needed for mild constipation.   gabapentin 300 MG capsule Commonly known as: NEURONTIN Take 300 mg by mouth at bedtime.   levocetirizine 5 MG tablet Commonly  known as: XYZAL Take 5 mg by mouth at bedtime.   losartan 25 MG tablet Commonly known as: COZAAR Take 25 mg by mouth daily.   methocarbamol 500 MG tablet Commonly known as: ROBAXIN Take 1 tablet (500 mg total) by mouth every 8 (eight) hours as needed for muscle spasms.   nebivolol 10 MG tablet Commonly known as: BYSTOLIC Take 10 mg by mouth every evening.   oxyCODONE 5 MG immediate release tablet Commonly known as: Oxy IR/ROXICODONE Take 1 tablet (5 mg total) by mouth every 4 (four) hours as needed for severe pain.   Ozempic (1 MG/DOSE) 4 MG/3ML Sopn Generic drug: Semaglutide (1 MG/DOSE) Inject 1 mg into the skin once a week.   polyethylene glycol 17 g packet Commonly known as: MIRALAX / GLYCOLAX Take 17 g by mouth daily.   QUERCETIN PO Take 1 capsule by mouth at bedtime. For  vitamin C   VITAMIN D3 PO Take 1 tablet by mouth daily.        Follow-up Information     Jene Every, MD. Call in 2 week(s).   Specialty: Orthopedic Surgery Why: As needed, If symptoms worsen Contact information: [address] [place] [address] [phone]                 Signed: Andrez Grime PA-C Orthopaedic Surgery 04/02/2023, 7:56 AM

## 2023-04-02 NOTE — Progress Notes (Signed)
Subjective: 1 Day Post-Op Procedure(s) (LRB): Hemi Laminotomy Microdiscectomy Lumbar Four-Lumbar Five Right (Right) Patient reports pain as mild.  Reports relief of leg pain. Still some sensitivity in the R foot but already much improved. No CP, SOB, N/V. Feels ready to go home. Voiding without difficulty.  Objective: Vital signs in last 24 hours: Temp:  [97.9 F (36.6 C)-99.1 F (37.3 C)] 98.5 F (36.9 C) (06/28 0353) Pulse Rate:  [74-103] 95 (06/28 0353) Resp:  [11-23] 20 (06/28 0353) BP: (115-150)/(68-95) 121/69 (06/28 0353) SpO2:  [92 %-100 %] 98 % (06/28 0353)  Intake/Output from previous day: 06/27 0701 - 06/28 0700 In: 1100 [I.V.:1000; IV Piggyback:100] Out: 125 [Urine:75; Blood:50] Intake/Output this shift: No intake/output data recorded.  No results for input(s): "HGB" in the last 72 hours. No results for input(s): "WBC", "RBC", "HCT", "PLT" in the last 72 hours. No results for input(s): "NA", "K", "CL", "CO2", "BUN", "CREATININE", "GLUCOSE", "CALCIUM" in the last 72 hours. No results for input(s): "LABPT", "INR" in the last 72 hours.  Neurologically intact ABD soft Neurovascular intact Sensation intact distally Intact pulses distally Dorsiflexion/Plantar flexion intact Incision: dressing C/D/I and no drainage No cellulitis present Compartment soft No calf pain or sign of DVT   Assessment/Plan: 1 Day Post-Op Procedure(s) (LRB): Hemi Laminotomy Microdiscectomy Lumbar Four-Lumbar Five Right (Right) Advance diet Up with therapy D/C IV fluids D/C to home today Discussed dressing instructions, D/C instructions, Lspine precautions   Dorothy Spark 04/02/2023, 7:55 AM

## 2023-04-02 NOTE — Evaluation (Addendum)
Physical Therapy Evaluation Patient Details Name: Sonya Watson MRN: [id number] DOB: 05/06/1966 Today's Date: 04/02/2023  History of Present Illness  57 y.o. female presenting for surgery with a dx of herniated disc, stenosis L4-L5 right with pain radiating down RLE. Pt is now s/p Hemi laminotomy microdiscectomy L4-5 right (Right) 04/01/23. PMH of Arthritis, HTN, pre-diabetes   Clinical Impression  Upon evaluation, patient supine with HOB elevated and spouse present. Prior to admission, patient required assistance with functional mobility; used furniture around the house for support. Occasional assistance provided from spouse for ADLs. Today patient was able to demonstrate bed mobility and transfers while maintaining spinal precautions. A few instances of minor LOB requiring minA from PT to recover. Formal MMT of RLE, patient presents with weaker dorsiflexion in comparison to LLE. Patient's spouse reported that they will be able to obtain RW from family member for household ambulation. Educated patient on exercise progression around the house, car transfers and reviewed spinal precautions. Recommending discharge to home with family support PRN.      Recommendations for follow up therapy are one component of a multi-disciplinary discharge planning process, led by the attending physician.  Recommendations may be updated based on patient status, additional functional criteria and insurance authorization.  Follow Up Recommendations       Assistance Recommended at Discharge PRN  Patient can return home with the following  A little help with walking and/or transfers;A little help with bathing/dressing/bathroom;Assist for transportation;Help with stairs or ramp for entrance    Equipment Recommendations    Recommendations for Other Services       Functional Status Assessment Patient has had a recent decline in their functional status and demonstrates the ability to make significant improvements in  function in a reasonable and predictable amount of time.     Precautions / Restrictions Precautions Precautions: Back Precaution Booklet Issued: Yes (comment) Precaution Comments: Able to recall 3/3 precations before and after mobilization Restrictions Weight Bearing Restrictions: No      Mobility  Bed Mobility Overal bed mobility: Modified Independent             General bed mobility comments: Patient able to demonstrate bed mobility at mod I while maintaining back precautions.    Transfers Overall transfer level: Needs assistance Equipment used: None Transfers: Sit to/from Stand Sit to Stand: Supervision           General transfer comment: Able to transfer without physical assistance.    Ambulation/Gait Ambulation/Gait assistance: Min guard, Min assist Gait Distance (Feet): 350 Feet Assistive device: None Gait Pattern/deviations: Step-through pattern, Decreased dorsiflexion - right, Decreased stride length Gait velocity: Decreased Gait velocity interpretation: 1.31 - 2.62 ft/sec, indicative of limited community ambulator   General Gait Details: Presented with step through pattern and decreased dorsiflexion on the RLE. VC provided for patient to heel strike and lift toes. 3 instances of minor LOB; requiring MinA from PT to recover. Pt reported that "shoes were reason for LOB, they grip the floor more than the socks".  One Standing rest break provided as Pt tearful and appeared stressed with foot drop. Encouragement provided from PT.  Stairs            Wheelchair Mobility    Modified Rankin (Stroke Patients Only)       Balance Overall balance assessment: Needs assistance Sitting-balance support: Feet supported Sitting balance-Leahy Scale: Good Sitting balance - Comments: Able to sit EOB and donn socks and shoes   Standing balance support: During functional activity Standing balance-Leahy  Scale: Poor Standing balance comment: Static standing pt  doesn't require UE support; Requires UE support intermittently with dynamic activites due to low clearance with RLE.                             Pertinent Vitals/Pain Pain Assessment Pain Assessment: No/denies pain    Home Living Family/patient expects to be discharged to:: Private residence Living Arrangements: Spouse/significant other Available Help at Discharge: Family;Available PRN/intermittently (husband will be off to provide 24/7 care during first week of DC) Type of Home: House Home Access: Ramped entrance       Home Layout: Two level;Able to live on main level with bedroom/bathroom Home Equipment: Shower seat - built [employer] (2 wheels) Additional Comments: Spouse able to obtain RW from another family member.    Prior Function Prior Level of Function : Needs assist       Physical Assist : Mobility (physical) Mobility (physical): Gait   Mobility Comments: Pt reports using furniture for household ambulation ADLs Comments: asisst from husband for bADLs     Hand Dominance   Dominant Hand: Right    Extremity/Trunk Assessment   Upper Extremity Assessment Upper Extremity Assessment: Defer to OT evaluation    Lower Extremity Assessment Lower Extremity Assessment: RLE deficits/detail RLE Deficits / Details: Formal MMT 3-/5 on RLE DF when compared to the LLE. RLE Sensation: WNL RLE Coordination: decreased gross motor    Cervical / Trunk Assessment Cervical / Trunk Assessment: Back Surgery  Communication   Communication: No difficulties  Cognition Arousal/Alertness: Awake/alert Behavior During Therapy: WFL for tasks assessed/performed Overall Cognitive Status: Within Functional Limits for tasks assessed                                          General Comments General comments (skin integrity, edema, etc.): Spouse present throughout the entire session, motivational and supportive. Educated patient on slowing gait speed and  continuing to practice AAROM dorsiflexion with RLE. Provided gait belt to spouse.     Exercises     Assessment/Plan    PT Assessment Patient does not need any further PT services  PT Problem List         PT Treatment Interventions      PT Goals (Current goals can be found in the Care Plan section)  Acute Rehab PT Goals Patient Stated Goal: Patient would like to return home with spouse. PT Goal Formulation: All assessment and education complete, DC therapy    Frequency       Co-evaluation               AM-PAC PT "6 Clicks" Mobility  Outcome Measure Help needed turning from your back to your side while in a flat bed without using bedrails?: None Help needed moving from lying on your back to sitting on the side of a flat bed without using bedrails?: None Help needed moving to and from a bed to a chair (including a wheelchair)?: None Help needed standing up from a chair using your arms (e.g., wheelchair or bedside chair)?: None Help needed to walk in hospital room?: A Little Help needed climbing 3-5 steps with a railing? : A Little 6 Click Score: 22    End of Session Equipment Utilized During Treatment: Gait belt Activity Tolerance: Patient tolerated treatment well Patient left: in bed;with nursing/sitter in  room;with family/visitor present Nurse Communication: Mobility status PT Visit Diagnosis: Other abnormalities of gait and mobility (R26.89);Muscle weakness (generalized) (M62.81);Difficulty in walking, not elsewhere classified (R26.2)    Time: 8295-6213 PT Time Calculation (min) (ACUTE ONLY): 18 min   Charges:   PT Evaluation $PT Eval Low Complexity: 1 Low          Christene Lye, SPT Acute Rehabilitation Services [phone] Secure chat preferred    Christene Lye 04/02/2023, 10:20 AM

## 2023-04-02 NOTE — Progress Notes (Signed)
Patient alert and oriented, mae's well, voiding adequate amount of urine, swallowing without difficulty, no c/o pain at time of discharge. Patient discharged home with family. Script and discharged instructions given to patient. Patient and family stated understanding of instructions given. Patient has an appointment with Dr. Beane ?

## 2023-04-14 LAB — SURGICAL PATHOLOGY

## 2023-06-25 ENCOUNTER — Ambulatory Visit (HOSPITAL_COMMUNITY): Payer: Self-pay | Admitting: Orthopedic Surgery

## 2023-07-01 NOTE — Pre-Procedure Instructions (Signed)
Surgical Instructions    Your procedure is scheduled on July 08, 2023.  Report to [facility] Main Entrance "A" at 10:30 A.M., then check in with the Admitting office.  Call this number if you have problems the morning of surgery:  [phone]   If you have any questions prior to your surgery date call [phone]: Open Monday-Friday 8am-4pm If you experience any cold or flu symptoms such as cough, fever, chills, shortness of breath, etc. between now and your scheduled surgery, please notify us at the above number     Remember:  Do not eat after midnight the night before your surgery  You may drink clear liquids until 9:30AM the morning of your surgery.   Clear liquids allowed are: Water, Non-Citrus Juices (without pulp), Carbonated Beverages, Clear Tea, Black Coffee ONLY (NO MILK, CREAM OR POWDERED CREAMER of any kind), and Gatorade    Take these medicines the morning of surgery with A SIP OF WATER:  nebivolol (BYSTOLIC)   If needed: gabapentin (NEURONTIN)  hydrOXYzine (ATARAX)  oxyCODONE (OXY IR/ROXICODONE)   As of today, STOP taking any Aspirin (unless otherwise instructed by your surgeon) Aleve, Naproxen, Ibuprofen, Motrin, Advil, Goody's, BC's, all herbal medications, fish oil, and all vitamins, and celecoxib (CELEBREX)   DO NOT TAKE Ozempic for 7 days prior to surgery. DO NOT take after 07/30/23.             is not responsible for any belongings or valuables.    Do NOT Smoke (Tobacco/Vaping)  24 hours prior to your procedure  If you use a CPAP at night, you may bring your mask for your overnight stay.   Contacts, glasses, hearing aids, dentures or partials may not be worn into surgery, please bring cases for these belongings   For patients admitted to the hospital, discharge time will be determined by your treatment team.   Patients discharged the day of surgery will not be allowed to drive home, and someone needs to stay with them for 24  hours.   SURGICAL WAITING ROOM VISITATION Patients having surgery or a procedure may have no more than 2 support people in the waiting area - these visitors may rotate.   Children under the age of 84 must have an adult with them who is not the patient. If the patient needs to stay at the hospital during part of their recovery, the visitor guidelines for inpatient rooms apply. Pre-op nurse will coordinate an appropriate time for 1 support person to accompany patient in pre-op.  This support person may not rotate.   Please refer to [email or web address] for the visitor guidelines for Inpatients (after your surgery is over and you are in a regular room).    Special instructions:    Oral Hygiene is also important to reduce your risk of infection.  Remember - BRUSH YOUR TEETH THE MORNING OF SURGERY WITH YOUR REGULAR TOOTHPASTE    Pre-operative 5 CHG Bath Instructions   You can play a key role in reducing the risk of infection after surgery. Your skin needs to be as free of germs as possible. You can reduce the number of germs on your skin by washing with CHG (chlorhexidine gluconate) soap before surgery. CHG is an antiseptic soap that kills germs and continues to kill germs even after washing.   DO NOT use if you have an allergy to chlorhexidine/CHG or antibacterial soaps. If your skin becomes reddened or irritated, stop using the CHG and notify one of our RNs at [phone].  Please shower with the CHG soap starting 4 days before surgery using the following schedule:     Please keep in mind the following:  DO NOT shave, including legs and underarms, starting the day of your first shower.   You may shave your face at any point before/day of surgery.  Place clean sheets on your bed the day you start using CHG soap. Use a clean washcloth (not used since being washed) for each shower. DO NOT sleep with pets once you start using the CHG.    CHG Shower Instructions:  If you choose to wash your hair and private area, wash first with your normal shampoo/soap.  After you use shampoo/soap, rinse your hair and body thoroughly to remove shampoo/soap residue.  Turn the water OFF and apply about 3 tablespoons (45 ml) of CHG soap to a CLEAN washcloth.  Apply CHG soap ONLY FROM YOUR NECK DOWN TO YOUR TOES (washing for 3-5 minutes)  DO NOT use CHG soap on face, private areas, open wounds, or sores.  Pay special attention to the area where your surgery is being performed.  If you are having back surgery, having someone wash your back for you may be helpful. Wait 2 minutes after CHG soap is applied, then you may rinse off the CHG soap.  Pat dry with a clean towel  Put on clean clothes/pajamas   If you choose to wear lotion, please use ONLY the CHG-compatible lotions on the back of this paper.     Additional instructions for the day of surgery: DO NOT APPLY any lotions, deodorants, cologne, or perfumes.   Put on clean/comfortable clothes.  Brush your teeth.  Ask your nurse before applying any prescription medications to the skin. Do not wear jewelry or makeup. Do not bring valuables to the hospital. Do not wear nail polish, gel polish, artificial nails, or any other type of covering on natural nails (fingers and toes) If you have artificial nails or gel coating that need to be removed by a nail salon, please have this removed prior to surgery. Artificial nails or gel coating may interfere with anesthesia's ability to adequately monitor your vital signs.     CHG Compatible Lotions   Aveeno Moisturizing lotion  Cetaphil Moisturizing Cream  Cetaphil Moisturizing Lotion  Clairol Herbal Essence Moisturizing Lotion, Dry Skin  Clairol Herbal Essence Moisturizing Lotion, Extra Dry Skin  Clairol Herbal Essence Moisturizing Lotion, Normal Skin  Curel Age Defying Therapeutic Moisturizing Lotion with Alpha Hydroxy  Curel Extreme Care Body  Lotion  Curel Soothing Hands Moisturizing Hand Lotion  Curel Therapeutic Moisturizing Cream, Fragrance-Free  Curel Therapeutic Moisturizing Lotion, Fragrance-Free  Curel Therapeutic Moisturizing Lotion, Original Formula  Eucerin Daily Replenishing Lotion  Eucerin Dry Skin Therapy Plus Alpha Hydroxy Crme  Eucerin Dry Skin Therapy Plus Alpha Hydroxy Lotion  Eucerin Original Crme  Eucerin Original Lotion  Eucerin Plus Crme Eucerin Plus Lotion  Eucerin TriLipid Replenishing Lotion  Keri Anti-Bacterial Hand Lotion  Keri Deep Conditioning Original Lotion Dry Skin Formula Softly Scented  Keri Deep Conditioning Original Lotion, Fragrance Free Sensitive Skin Formula  Keri Lotion Fast Absorbing Fragrance Free Sensitive Skin Formula  Keri Lotion Fast Absorbing Softly Scented Dry Skin Formula  Keri Original Lotion  Keri Skin Renewal Lotion Keri Silky Smooth Lotion  Keri Silky Smooth Sensitive Skin Lotion  Nivea Body Creamy Conditioning Oil  Nivea Body Extra Enriched Teacher, adult education Moisturizing Lotion Nivea Crme  Nivea Skin Firming Lotion  NutraDerm 30 Skin Lotion  NutraDerm Skin Lotion  NutraDerm Therapeutic Skin Cream  NutraDerm Therapeutic Skin Lotion  ProShield Protective Hand Cream  Provon moisturizing lotion     If you received a COVID test during your pre-op visit, it is requested that you wear a mask when out in public, stay away from anyone that may not be feeling well, and notify your surgeon if you develop symptoms. If you have been in contact with anyone that has tested positive in the last 10 days, please notify your surgeon.    Please read over the following fact sheets that you were given.

## 2023-07-02 ENCOUNTER — Encounter (HOSPITAL_COMMUNITY)
Admission: RE | Admit: 2023-07-02 | Discharge: 2023-07-02 | Disposition: A | Discharge status: Home / Self Care | Payer: Federal, State, Local not specified - PPO | Source: Ambulatory Visit | Attending: Orthopedic Surgery | Admitting: Orthopedic Surgery

## 2023-07-02 ENCOUNTER — Encounter (HOSPITAL_COMMUNITY): Payer: Self-pay

## 2023-07-02 ENCOUNTER — Other Ambulatory Visit: Payer: Self-pay

## 2023-07-02 VITALS — BP 91/43 | HR 84 | Temp 98.2°F | Resp 17 | Ht 66.0 in | Wt 243.0 lb

## 2023-07-02 DIAGNOSIS — E669 Obesity, unspecified: Secondary | ICD-10-CM | POA: Insufficient documentation

## 2023-07-02 DIAGNOSIS — R7303 Prediabetes: Secondary | ICD-10-CM | POA: Diagnosis not present

## 2023-07-02 DIAGNOSIS — Z01818 Encounter for other preprocedural examination: Secondary | ICD-10-CM | POA: Diagnosis present

## 2023-07-02 DIAGNOSIS — Z87891 Personal history of nicotine dependence: Secondary | ICD-10-CM | POA: Insufficient documentation

## 2023-07-02 DIAGNOSIS — M5116 Intervertebral disc disorders with radiculopathy, lumbar region: Secondary | ICD-10-CM | POA: Insufficient documentation

## 2023-07-02 DIAGNOSIS — E7800 Pure hypercholesterolemia, unspecified: Secondary | ICD-10-CM | POA: Diagnosis not present

## 2023-07-02 DIAGNOSIS — I10 Essential (primary) hypertension: Secondary | ICD-10-CM

## 2023-07-02 DIAGNOSIS — M1990 Unspecified osteoarthritis, unspecified site: Secondary | ICD-10-CM | POA: Diagnosis not present

## 2023-07-02 DIAGNOSIS — Z6839 Body mass index (BMI) 39.0-39.9, adult: Secondary | ICD-10-CM | POA: Diagnosis not present

## 2023-07-02 LAB — TYPE AND SCREEN
ABO/RH(D): O POS
Antibody Screen: NEGATIVE

## 2023-07-02 LAB — SURGICAL PCR SCREEN
MRSA, PCR: NEGATIVE
Staphylococcus aureus: NEGATIVE

## 2023-07-02 LAB — GLUCOSE, CAPILLARY: Glucose-Capillary: 126 mg/dL — ABNORMAL HIGH (ref 70–99)

## 2023-07-02 NOTE — Progress Notes (Signed)
PCP - Kennon Holter FNP-C Cardiologist - no current cardiologist- pt states she has gone to cardiology preventatively only  PPM/ICD - denies   Chest x-ray - denies EKG - EKG 03/15/2023 media tab in epic- pt reports she had repeat on 06/23/23- copy requested  Stress Test - pt unsure ECHO - 11/20/2022  Cardiac Cath - denies  Sleep Study - denies   Fasting Blood Sugar - N/A pt pre-DM  Last dose of GLP1 agonist-  Ozempic- Pt reports she has been off of this for a few weeks.    Blood Thinner Instructions: N/A Aspirin Instructions:N/A  ERAS Protcol - ERAS per protocol   COVID TEST- N/A   Anesthesia review: follow up requested records- pt reports she had labs (CBC, CMP) done on 06/23/23. Results verified on her phone in patient portal. Records requested from [facility]. Pt BP 91/43 in PAT- pt reports her BP has been lower between 90-100 recently. Pt states she is not having any dizziness or symptoms. Pt states she will message her PCP today regarding possibly coming off one of her blood pressure medications. Pt given phone number to PAT to call if she is placed on new medications or taken off medications.   Patient denies shortness of breath, fever, cough and chest pain at PAT appointment   All instructions explained to the patient, with a verbal understanding of the material. Patient agrees to go over the instructions while at home for a better understanding.  The opportunity to ask questions was provided.

## 2023-07-05 NOTE — Progress Notes (Addendum)
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  History includes former smoker (quit 10/05/05), HTN, hypercholesterolemia, prediabetes, arthritis, spinal surgery (L4-5 right microdiscectomy, hemilaminotomy/foraminotomy L5 04/01/23). BMI is consistent with obesity.  She had a preoperative evaluation at [facility] at [facility] by Kennon Holter, FNP-C on 06/23/23 including labs and EKG (copies of labs and EKG on shadow chart). She signed a not of clearance for surgery.   She reported a previous evaluation with cardiology at [facility] due to family history of CVD. She reported a remote stress test which was normal and and echocardiogram done in February 2024. Results from 11/20/22 echo showed a normal LV systolic function with EF 60 to 65%, moderately dilated left atrium, normal RV size and function, normal valvular anatomy and function.  There was no stress test results on file.   BP 91/43 at PAT. She denied symptomatic hypotension. Reported SBP had been running ~ 90-100 and was reaching out to PCP to inquire about if need for any medication changes. As of 07/02/23, BP medications included losartan 25 mg daily, nebivolol 10 mg Q PM. She is also on as needed oxycodone 5-10 mg.   She reported being off Ozempic for weeks.   Anesthesia team to evaluate on the day of surgery. She will get vitals on arrival.  VS: BP (!) 91/43 Comment: meredith was aware  Pulse 84   Temp 36.8 C   Resp 17   Ht 5\' 6"  (1.676 m)   Wt 110.2 kg   LMP  (LMP Unknown)   SpO2 100%   BMI 39.22 kg/m   PROVIDERS: Coralee Rud, PA-C is listed as PCP [facility])   LABS: She had a CBC with Diff , PT/INR, and CMET on 06/23/23 at [facility]. Copies of results are on chart and include: PT 11.2, INR 1.0, WBC 8.2, hemoglobin 13.2, hematocrit 41.3, platelet count 282, sodium 140, potassium 4.6, glucose 117, BUN 18, creatinine 0.7, albumin 4.3, total bilirubin 1.148, AST 46, ALT 24.    Labs Reviewed  GLUCOSE, CAPILLARY - Abnormal; Notable for the following components:      Result Value   Glucose-Capillary 126 (*)    All other components within normal limits  SURGICAL PCR SCREEN  TYPE AND SCREEN    EKG: 06/23/23 [facility]): Sinus rhythm. Left atrial enlargement. 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    CV:  TTE 11/20/2022 [facility], copy scanned under Media tab, Correspondence, Enc Date 04/01/23): Conclusions: 1.  The left ventricle size is normal. 2.  There is mild concentric left ventricular hypertrophy. 3.  Overall left ventricular systolic function is normal with an EF between 60 to 65%. 4.  The left atrium is moderately dilated. 5.  The right ventricle is normal in size and function. 6.  The right atrium is normal in size and function. 7.  Mild mitral annular calcification is present. 8.  Normal valve anatomy and function; no pericardial effusion or intracardiac mass.  Normal thoracic aorta and aortic arch.    Past Medical History:  Diagnosis Date  Arthritis    High cholesterol    Hypertension    on bystolic-diagnosed htn 1 year ago   Pre-diabetes    Seasonal allergies     Past Surgical History:  Procedure Laterality Date   CHOLECYSTECTOMY     ESSURE TUBAL LIGATION     done in GYN office   LUMBAR LAMINECTOMY/DECOMPRESSION MICRODISCECTOMY Right 04/01/2023   Procedure: Hemi Laminotomy Microdiscectomy Lumbar Four-Lumbar Five Right;  Surgeon: Jene Every, MD;  Location: [facility] OR;  Service: Orthopedics;  Laterality: Right;     MEDICATIONS:  bisacodyl (DULCOLAX) 5 MG EC tablet   celecoxib (CELEBREX) 200 MG capsule   Cholecalciferol (VITAMIN D3 PO)   docusate sodium (COLACE) 100 MG capsule   gabapentin (NEURONTIN) 300 MG capsule   hydrOXYzine (ATARAX) 25 MG tablet   levocetirizine (XYZAL) 5 MG tablet   losartan (COZAAR) 25 MG tablet   methocarbamol (ROBAXIN) 500 MG tablet   nebivolol (BYSTOLIC) 10 MG tablet   oxyCODONE (OXY IR/ROXICODONE) 5 MG immediate release tablet   OZEMPIC, 1 MG/DOSE, 4 MG/3ML SOPN   polyethylene glycol (MIRALAX / GLYCOLAX) 17 g packet   QUERCETIN PO   No current facility-administered medications for this encounter.    Shonna Chock, PA-C Surgical Short Stay/Anesthesiology [facility] Phone [phone] [facility] Phone [phone] 07/05/2023 11:00 AM

## 2023-07-05 NOTE — Anesthesia Preprocedure Evaluation (Signed)
Anesthesia Evaluation  Patient identified by MRN, date of birth, ID band Patient awake    Reviewed: Allergy & Precautions, NPO status , Patient's Chart, lab work & pertinent test results  Airway Mallampati: III  TM Distance: >3 FB Neck ROM: Full    Dental no notable dental hx.    Pulmonary former smoker   Pulmonary exam normal        Cardiovascular hypertension, Pt. on medications and Pt. on home beta blockers  Rhythm:Regular Rate:Normal   CV:  TTE 11/20/2022 [facility], copy scanned under Media tab, Correspondence, Enc Date 04/01/23): Conclusions: 1.  The left ventricle size is normal. 2.  There is mild concentric left ventricular hypertrophy. 3.  Overall left ventricular systolic function is normal with an EF between 60 to 65%. 4.  The left atrium is moderately dilated. 5.  The right ventricle is normal in size and function. 6.  The right atrium is normal in size and function. 7.  Mild mitral annular calcification is present. 8.  Normal valve anatomy and function; no pericardial effusion or intracardiac mass.  Normal thoracic aorta and aortic arch.    Neuro/Psych negative neurological ROS  negative psych ROS   GI/Hepatic negative GI ROS, Neg liver ROS,,,  Endo/Other  negative endocrine ROS    Renal/GU negative Renal ROS  negative genitourinary   Musculoskeletal  (+) Arthritis ,    Abdominal Normal abdominal exam  (+)   Peds  Hematology   Anesthesia Other Findings   Reproductive/Obstetrics                             Anesthesia Physical Anesthesia Plan  ASA: 3  Anesthesia Plan: General   Post-op Pain Management: Celebrex PO (pre-op)*, Tylenol PO (pre-op)* and Ketamine IV*   Induction: Intravenous  PONV Risk Score and Plan: 3 and Ondansetron, Dexamethasone, Midazolam and Treatment may vary due to age or medical condition  Airway Management Planned: Mask and Oral  ETT  Additional Equipment: None  Intra-op Plan:   Post-operative Plan: Extubation in OR  Informed Consent: I have reviewed the patients History and Physical, chart, labs and discussed the procedure including the risks, benefits and alternatives for the proposed anesthesia with the patient or authorized representative who has indicated his/her understanding and acceptance.     Dental advisory given  Plan Discussed with: CRNA  Anesthesia Plan Comments: (PAT note written 07/05/2023 by Shonna Chock, PA-C.  )       Anesthesia Quick Evaluation

## 2023-07-08 ENCOUNTER — Inpatient Hospital Stay (HOSPITAL_COMMUNITY): Payer: Federal, State, Local not specified - PPO | Admitting: Vascular Surgery

## 2023-07-08 ENCOUNTER — Inpatient Hospital Stay (HOSPITAL_COMMUNITY)
Admission: RE | Disposition: A | Discharge status: Home / Self Care | Payer: Self-pay | Source: Home / Self Care | Attending: Orthopedic Surgery

## 2023-07-08 ENCOUNTER — Encounter (HOSPITAL_COMMUNITY): Payer: Self-pay | Admitting: Orthopedic Surgery

## 2023-07-08 ENCOUNTER — Other Ambulatory Visit: Payer: Self-pay

## 2023-07-08 ENCOUNTER — Inpatient Hospital Stay (HOSPITAL_COMMUNITY): Payer: Federal, State, Local not specified - PPO | Admitting: Anesthesiology

## 2023-07-08 ENCOUNTER — Inpatient Hospital Stay (HOSPITAL_COMMUNITY): Payer: Federal, State, Local not specified - PPO

## 2023-07-08 ENCOUNTER — Inpatient Hospital Stay (HOSPITAL_COMMUNITY)
Admission: RE | Admit: 2023-07-08 | Discharge: 2023-07-11 | DRG: 451 | Disposition: A | Discharge status: Home / Self Care | Payer: Federal, State, Local not specified - PPO | Attending: Orthopedic Surgery | Admitting: Orthopedic Surgery

## 2023-07-08 DIAGNOSIS — M5116 Intervertebral disc disorders with radiculopathy, lumbar region: Secondary | ICD-10-CM | POA: Diagnosis present

## 2023-07-08 DIAGNOSIS — E7800 Pure hypercholesterolemia, unspecified: Secondary | ICD-10-CM | POA: Diagnosis present

## 2023-07-08 DIAGNOSIS — R7303 Prediabetes: Secondary | ICD-10-CM | POA: Diagnosis present

## 2023-07-08 DIAGNOSIS — Z881 Allergy status to other antibiotic agents status: Secondary | ICD-10-CM | POA: Diagnosis not present

## 2023-07-08 DIAGNOSIS — I10 Essential (primary) hypertension: Secondary | ICD-10-CM | POA: Diagnosis present

## 2023-07-08 DIAGNOSIS — Z79899 Other long term (current) drug therapy: Secondary | ICD-10-CM

## 2023-07-08 DIAGNOSIS — Z7985 Long-term (current) use of injectable non-insulin antidiabetic drugs: Secondary | ICD-10-CM | POA: Diagnosis not present

## 2023-07-08 DIAGNOSIS — Z981 Arthrodesis status: Secondary | ICD-10-CM | POA: Diagnosis present

## 2023-07-08 DIAGNOSIS — M5187 Other intervertebral disc disorders, lumbosacral region: Secondary | ICD-10-CM | POA: Diagnosis present

## 2023-07-08 HISTORY — PX: TRANSFORAMINAL LUMBAR INTERBODY FUSION (TLIF) WITH PEDICLE SCREW FIXATION 1 LEVEL: SHX6141

## 2023-07-08 LAB — POCT I-STAT, CHEM 8
BUN: 10 mg/dL (ref 6–20)
Calcium, Ion: 1.03 mmol/L — ABNORMAL LOW (ref 1.15–1.40)
Chloride: 105 mmol/L (ref 98–111)
Creatinine, Ser: 0.8 mg/dL (ref 0.44–1.00)
Glucose, Bld: 89 mg/dL (ref 70–99)
HCT: 40 % (ref 36.0–46.0)
Hemoglobin: 13.6 g/dL (ref 12.0–15.0)
Potassium: 3.7 mmol/L (ref 3.5–5.1)
Sodium: 137 mmol/L (ref 135–145)
TCO2: 25 mmol/L (ref 22–32)

## 2023-07-08 LAB — ABO/RH: ABO/RH(D): O POS

## 2023-07-08 SURGERY — TRANSFORAMINAL LUMBAR INTERBODY FUSION (TLIF) WITH PEDICLE SCREW FIXATION 1 LEVEL
Anesthesia: General | Site: Back

## 2023-07-08 MED ORDER — TRANEXAMIC ACID-NACL 1000-0.7 MG/100ML-% IV SOLN
1000.0000 mg | INTRAVENOUS | Status: AC
  Administered 2023-07-08: 1000 mg via INTRAVENOUS
  Filled 2023-07-08: qty 100

## 2023-07-08 MED ORDER — PROPOFOL 500 MG/50ML IV EMUL
INTRAVENOUS | Status: DC | PRN
Start: 2023-07-08 — End: 2023-07-08
  Administered 2023-07-08: 65 ug/kg/min via INTRAVENOUS

## 2023-07-08 MED ORDER — THROMBIN 20000 UNITS EX KIT
PACK | CUTANEOUS | Status: AC
  Filled 2023-07-08: qty 1

## 2023-07-08 MED ORDER — PHENOL 1.4 % MT LIQD: 1.0000 | OROMUCOSAL | Status: DC | PRN

## 2023-07-08 MED ORDER — GABAPENTIN 300 MG PO CAPS
300.0000 mg | ORAL_CAPSULE | Freq: Three times a day (TID) | ORAL | Status: DC | PRN
  Administered 2023-07-08 – 2023-07-09 (×3): 300 mg via ORAL
  Filled 2023-07-08 (×3): qty 1

## 2023-07-08 MED ORDER — THROMBIN 20000 UNITS EX SOLR
CUTANEOUS | Status: DC | PRN
  Administered 2023-07-08: 20 mL via TOPICAL

## 2023-07-08 MED ORDER — PROPOFOL 10 MG/ML IV BOLUS
INTRAVENOUS | Status: DC | PRN
  Administered 2023-07-08: 80 mg via INTRAVENOUS
  Administered 2023-07-08: 200 mg via INTRAVENOUS

## 2023-07-08 MED ORDER — CHLORHEXIDINE GLUCONATE 0.12 % MT SOLN
15.0000 mL | Freq: Once | OROMUCOSAL | Status: AC
  Administered 2023-07-08: 15 mL via OROMUCOSAL
  Filled 2023-07-08: qty 15

## 2023-07-08 MED ORDER — LOSARTAN POTASSIUM 50 MG PO TABS
25.0000 mg | ORAL_TABLET | Freq: Every day | ORAL | Status: DC
  Administered 2023-07-09 – 2023-07-10 (×2): 25 mg via ORAL
  Filled 2023-07-08 (×3): qty 1

## 2023-07-08 MED ORDER — FENTANYL CITRATE (PF) 100 MCG/2ML IJ SOLN
25.0000 ug | INTRAMUSCULAR | Status: DC | PRN
  Administered 2023-07-08: 50 ug via INTRAVENOUS

## 2023-07-08 MED ORDER — HYDROMORPHONE HCL 1 MG/ML IJ SOLN: 1.0000 mg | INTRAMUSCULAR | Status: AC | PRN

## 2023-07-08 MED ORDER — METHOCARBAMOL 500 MG PO TABS
500.0000 mg | ORAL_TABLET | Freq: Four times a day (QID) | ORAL | Status: DC | PRN
  Administered 2023-07-08 – 2023-07-11 (×6): 500 mg via ORAL
  Filled 2023-07-08 (×6): qty 1

## 2023-07-08 MED ORDER — ONDANSETRON HCL 4 MG/2ML IJ SOLN
INTRAMUSCULAR | Status: DC | PRN
  Administered 2023-07-08: 4 mg via INTRAVENOUS

## 2023-07-08 MED ORDER — BUPIVACAINE-EPINEPHRINE 0.25% -1:200000 IJ SOLN
INTRAMUSCULAR | Status: DC | PRN
  Administered 2023-07-08: 10 mL

## 2023-07-08 MED ORDER — ORAL CARE MOUTH RINSE: 15.0000 mL | Freq: Once | OROMUCOSAL | Status: AC

## 2023-07-08 MED ORDER — NEBIVOLOL HCL 10 MG PO TABS
10.0000 mg | ORAL_TABLET | Freq: Every evening | ORAL | Status: DC
  Administered 2023-07-10: 10 mg via ORAL
  Filled 2023-07-08 (×3): qty 1

## 2023-07-08 MED ORDER — SUCCINYLCHOLINE CHLORIDE 200 MG/10ML IV SOSY
PREFILLED_SYRINGE | INTRAVENOUS | Status: AC
  Filled 2023-07-08: qty 10

## 2023-07-08 MED ORDER — POLYETHYLENE GLYCOL 3350 17 G PO PACK: 17.0000 g | PACK | Freq: Every day | ORAL | Status: DC | PRN

## 2023-07-08 MED ORDER — HYDROMORPHONE HCL 1 MG/ML IJ SOLN
INTRAMUSCULAR | Status: AC
  Filled 2023-07-08: qty 1

## 2023-07-08 MED ORDER — FENTANYL CITRATE (PF) 250 MCG/5ML IJ SOLN
INTRAMUSCULAR | Status: AC
  Filled 2023-07-08: qty 5

## 2023-07-08 MED ORDER — KETAMINE HCL 50 MG/5ML IJ SOSY
PREFILLED_SYRINGE | INTRAMUSCULAR | Status: AC
  Filled 2023-07-08: qty 5

## 2023-07-08 MED ORDER — PHENYLEPHRINE 80 MCG/ML (10ML) SYRINGE FOR IV PUSH (FOR BLOOD PRESSURE SUPPORT)
PREFILLED_SYRINGE | INTRAVENOUS | Status: DC | PRN
  Administered 2023-07-08 (×4): 80 ug via INTRAVENOUS

## 2023-07-08 MED ORDER — MIDAZOLAM HCL 2 MG/2ML IJ SOLN
INTRAMUSCULAR | Status: AC
  Filled 2023-07-08: qty 2

## 2023-07-08 MED ORDER — ACETAMINOPHEN 10 MG/ML IV SOLN
INTRAVENOUS | Status: AC
  Filled 2023-07-08: qty 100

## 2023-07-08 MED ORDER — SUCCINYLCHOLINE CHLORIDE 200 MG/10ML IV SOSY
PREFILLED_SYRINGE | INTRAVENOUS | Status: DC | PRN
  Administered 2023-07-08: 120 mg via INTRAVENOUS

## 2023-07-08 MED ORDER — DEXAMETHASONE SODIUM PHOSPHATE 10 MG/ML IJ SOLN
INTRAMUSCULAR | Status: DC | PRN
  Administered 2023-07-08: 5 mg via INTRAVENOUS

## 2023-07-08 MED ORDER — SODIUM CHLORIDE 0.9% FLUSH: 3.0000 mL | INTRAVENOUS | Status: DC | PRN

## 2023-07-08 MED ORDER — SODIUM CHLORIDE 0.9% FLUSH
3.0000 mL | Freq: Two times a day (BID) | INTRAVENOUS | Status: DC
  Administered 2023-07-08 – 2023-07-10 (×4): 3 mL via INTRAVENOUS

## 2023-07-08 MED ORDER — ACETAMINOPHEN 650 MG RE SUPP: 650.0000 mg | RECTAL | Status: DC | PRN

## 2023-07-08 MED ORDER — 0.9 % SODIUM CHLORIDE (POUR BTL) OPTIME
TOPICAL | Status: DC | PRN
  Administered 2023-07-08: 2000 mL

## 2023-07-08 MED ORDER — DEXAMETHASONE SODIUM PHOSPHATE 4 MG/ML IJ SOLN
4.0000 mg | Freq: Four times a day (QID) | INTRAMUSCULAR | Status: DC
  Administered 2023-07-08 – 2023-07-10 (×4): 4 mg via INTRAVENOUS
  Filled 2023-07-08 (×6): qty 1

## 2023-07-08 MED ORDER — PHENYLEPHRINE HCL-NACL 20-0.9 MG/250ML-% IV SOLN
INTRAVENOUS | Status: DC | PRN
  Administered 2023-07-08: 40 ug/min via INTRAVENOUS

## 2023-07-08 MED ORDER — OXYCODONE HCL 5 MG PO TABS
5.0000 mg | ORAL_TABLET | ORAL | Status: DC | PRN
  Filled 2023-07-08: qty 1

## 2023-07-08 MED ORDER — CEFAZOLIN SODIUM-DEXTROSE 2-4 GM/100ML-% IV SOLN
2.0000 g | INTRAVENOUS | Status: AC
  Administered 2023-07-08 (×2): 2 g via INTRAVENOUS
  Filled 2023-07-08: qty 100

## 2023-07-08 MED ORDER — SODIUM CHLORIDE 0.9 % IV SOLN: 250.0000 mL | INTRAVENOUS | Status: DC

## 2023-07-08 MED ORDER — ONDANSETRON HCL 4 MG/2ML IJ SOLN: 4.0000 mg | Freq: Four times a day (QID) | INTRAMUSCULAR | Status: DC | PRN

## 2023-07-08 MED ORDER — ONDANSETRON HCL 4 MG PO TABS: 4.0000 mg | ORAL_TABLET | Freq: Four times a day (QID) | ORAL | Status: DC | PRN

## 2023-07-08 MED ORDER — HYDROXYZINE HCL 25 MG PO TABS
25.0000 mg | ORAL_TABLET | Freq: Four times a day (QID) | ORAL | Status: DC | PRN
  Administered 2023-07-08: 25 mg via ORAL
  Filled 2023-07-08: qty 1

## 2023-07-08 MED ORDER — MENTHOL 3 MG MT LOZG: 1.0000 | LOZENGE | OROMUCOSAL | Status: DC | PRN

## 2023-07-08 MED ORDER — OXYCODONE HCL 5 MG PO TABS
ORAL_TABLET | ORAL | Status: AC
  Filled 2023-07-08: qty 1

## 2023-07-08 MED ORDER — KETAMINE HCL 50 MG/5ML IJ SOSY
PREFILLED_SYRINGE | INTRAMUSCULAR | Status: DC | PRN
Start: 2023-07-08 — End: 2023-07-08
  Administered 2023-07-08 (×3): 10 mg via INTRAVENOUS
  Administered 2023-07-08: 20 mg via INTRAVENOUS

## 2023-07-08 MED ORDER — CELECOXIB 200 MG PO CAPS
200.0000 mg | ORAL_CAPSULE | Freq: Once | ORAL | Status: AC
  Administered 2023-07-08: 200 mg via ORAL
  Filled 2023-07-08: qty 1

## 2023-07-08 MED ORDER — LORATADINE 10 MG PO TABS
5.0000 mg | ORAL_TABLET | Freq: Every day | ORAL | Status: DC
  Administered 2023-07-08 – 2023-07-10 (×3): 5 mg via ORAL
  Filled 2023-07-08 (×3): qty 1

## 2023-07-08 MED ORDER — DEXAMETHASONE 4 MG PO TABS
4.0000 mg | ORAL_TABLET | Freq: Four times a day (QID) | ORAL | Status: DC
  Administered 2023-07-09 – 2023-07-11 (×6): 4 mg via ORAL
  Filled 2023-07-08 (×9): qty 1

## 2023-07-08 MED ORDER — HYDROMORPHONE HCL 1 MG/ML IJ SOLN
0.2500 mg | INTRAMUSCULAR | Status: DC | PRN
  Administered 2023-07-08 (×4): 0.5 mg via INTRAVENOUS

## 2023-07-08 MED ORDER — MIDAZOLAM HCL 2 MG/2ML IJ SOLN: 0.5000 mg | Freq: Once | INTRAMUSCULAR | Status: DC | PRN

## 2023-07-08 MED ORDER — ACETAMINOPHEN 500 MG PO TABS
1000.0000 mg | ORAL_TABLET | Freq: Once | ORAL | Status: AC
  Administered 2023-07-08: 1000 mg via ORAL
  Filled 2023-07-08: qty 2

## 2023-07-08 MED ORDER — CEFAZOLIN SODIUM-DEXTROSE 2-4 GM/100ML-% IV SOLN
INTRAVENOUS | Status: AC
  Filled 2023-07-08: qty 100

## 2023-07-08 MED ORDER — PROPOFOL 10 MG/ML IV BOLUS
INTRAVENOUS | Status: AC
  Filled 2023-07-08: qty 20

## 2023-07-08 MED ORDER — FENTANYL CITRATE (PF) 100 MCG/2ML IJ SOLN
INTRAMUSCULAR | Status: AC
  Filled 2023-07-08: qty 2

## 2023-07-08 MED ORDER — LACTATED RINGERS IV SOLN: INTRAVENOUS | Status: DC

## 2023-07-08 MED ORDER — CEFAZOLIN SODIUM-DEXTROSE 1-4 GM/50ML-% IV SOLN
1.0000 g | Freq: Three times a day (TID) | INTRAVENOUS | Status: AC
  Administered 2023-07-08 – 2023-07-09 (×2): 1 g via INTRAVENOUS
  Filled 2023-07-08 (×3): qty 50

## 2023-07-08 MED ORDER — HYDROMORPHONE HCL 1 MG/ML IJ SOLN: 0.5000 mg | INTRAMUSCULAR | Status: DC | PRN

## 2023-07-08 MED ORDER — SODIUM CHLORIDE 0.9 % IV SOLN
0.0125 ug/kg/min | INTRAVENOUS | Status: AC
  Administered 2023-07-08: .08 ug/kg/min via INTRAVENOUS
  Filled 2023-07-08: qty 2000

## 2023-07-08 MED ORDER — LIDOCAINE 2% (20 MG/ML) 5 ML SYRINGE
INTRAMUSCULAR | Status: AC
  Filled 2023-07-08: qty 5

## 2023-07-08 MED ORDER — LIDOCAINE 2% (20 MG/ML) 5 ML SYRINGE
INTRAMUSCULAR | Status: DC | PRN
  Administered 2023-07-08: 40 mg via INTRAVENOUS
  Administered 2023-07-08: 60 mg via INTRAVENOUS

## 2023-07-08 MED ORDER — PROPOFOL 1000 MG/100ML IV EMUL
INTRAVENOUS | Status: AC
  Filled 2023-07-08: qty 300

## 2023-07-08 MED ORDER — PHENYLEPHRINE HCL-NACL 20-0.9 MG/250ML-% IV SOLN
INTRAVENOUS | Status: AC
  Filled 2023-07-08: qty 250

## 2023-07-08 MED ORDER — PHENYLEPHRINE 80 MCG/ML (10ML) SYRINGE FOR IV PUSH (FOR BLOOD PRESSURE SUPPORT)
PREFILLED_SYRINGE | INTRAVENOUS | Status: AC
  Filled 2023-07-08: qty 10

## 2023-07-08 MED ORDER — OXYCODONE HCL 5 MG/5ML PO SOLN: 5.0000 mg | Freq: Once | ORAL | Status: DC | PRN

## 2023-07-08 MED ORDER — OXYCODONE HCL 5 MG PO TABS: 5.0000 mg | ORAL_TABLET | Freq: Once | ORAL | Status: DC | PRN

## 2023-07-08 MED ORDER — BUPIVACAINE-EPINEPHRINE (PF) 0.25% -1:200000 IJ SOLN
INTRAMUSCULAR | Status: AC
  Filled 2023-07-08: qty 30

## 2023-07-08 MED ORDER — OXYCODONE HCL 5 MG PO TABS
10.0000 mg | ORAL_TABLET | ORAL | Status: DC | PRN
  Administered 2023-07-08 – 2023-07-11 (×15): 10 mg via ORAL
  Filled 2023-07-08 (×15): qty 2

## 2023-07-08 MED ORDER — ACETAMINOPHEN 325 MG PO TABS
650.0000 mg | ORAL_TABLET | ORAL | Status: DC | PRN
  Administered 2023-07-09 – 2023-07-11 (×4): 650 mg via ORAL
  Filled 2023-07-08 (×5): qty 2

## 2023-07-08 MED ORDER — METHOCARBAMOL 1000 MG/10ML IJ SOLN: 500.0000 mg | Freq: Four times a day (QID) | INTRAVENOUS | Status: DC | PRN

## 2023-07-08 MED ORDER — FENTANYL CITRATE (PF) 250 MCG/5ML IJ SOLN
INTRAMUSCULAR | Status: DC | PRN
  Administered 2023-07-08 (×5): 50 ug via INTRAVENOUS
  Administered 2023-07-08: 150 ug via INTRAVENOUS
  Administered 2023-07-08: 50 ug via INTRAVENOUS

## 2023-07-08 MED ORDER — MEPERIDINE HCL 25 MG/ML IJ SOLN: 6.2500 mg | INTRAMUSCULAR | Status: DC | PRN

## 2023-07-08 MED ORDER — ACETAMINOPHEN 10 MG/ML IV SOLN
1000.0000 mg | Freq: Once | INTRAVENOUS | Status: DC | PRN
  Administered 2023-07-08: 1000 mg via INTRAVENOUS

## 2023-07-08 MED ORDER — MIDAZOLAM HCL 2 MG/2ML IJ SOLN
INTRAMUSCULAR | Status: DC | PRN
  Administered 2023-07-08: 2 mg via INTRAVENOUS

## 2023-07-08 SURGICAL SUPPLY — 75 items
AGENT HMST KT MTR STRL THRMB (HEMOSTASIS) ×1
BAG COUNTER SPONGE SURGICOUNT (BAG) ×1 IMPLANT
BAG SPNG CNTER NS LX DISP (BAG) ×1
BLADE CLIPPER SURG (BLADE) IMPLANT
BUR EGG ELITE 4.0 (BURR) ×1 IMPLANT
BUR MATCHSTICK NEURO 3.0 LAGG (BURR) ×1 IMPLANT
CAGE MOD EX PL 7X9X24 17D (Cage) IMPLANT
CANISTER SUCT 3000ML PPV (MISCELLANEOUS) ×1 IMPLANT
CAP RELINE MOD TULIP RMM (Cap) IMPLANT
CLIP NEUROVISION LG (NEUROSURGERY SUPPLIES) IMPLANT
CLSR STERI-STRIP ANTIMIC 1/2X4 (GAUZE/BANDAGES/DRESSINGS) ×1 IMPLANT
COVER SURGICAL LIGHT HANDLE (MISCELLANEOUS) ×1 IMPLANT
DRAIN CHANNEL 15F RND FF W/TCR (WOUND CARE) IMPLANT
DRAPE C-ARM 42X72 X-RAY (DRAPES) ×1 IMPLANT
DRAPE C-ARMOR (DRAPES) ×1 IMPLANT
DRAPE POUCH INSTRU U-SHP 10X18 (DRAPES) IMPLANT
DRAPE SURG 17X23 STRL (DRAPES) ×1 IMPLANT
DRAPE U-SHAPE 47X51 STRL (DRAPES) ×1 IMPLANT
DRSG OPSITE POSTOP 4X6 (GAUZE/BANDAGES/DRESSINGS) IMPLANT
DRSG OPSITE POSTOP 4X8 (GAUZE/BANDAGES/DRESSINGS) ×1 IMPLANT
DURAPREP 26ML APPLICATOR (WOUND CARE) ×1 IMPLANT
ELECT BLADE 6.5 EXT (BLADE) ×1 IMPLANT
ELECT PENCIL ROCKER SW 15FT (MISCELLANEOUS) ×1 IMPLANT
ELECT REM PT RETURN 9FT ADLT (ELECTROSURGICAL) ×1
ELECTRODE REM PT RTRN 9FT ADLT (ELECTROSURGICAL) ×1 IMPLANT
FEE INTRAOP MONITOR IMPULS NCS (MISCELLANEOUS) IMPLANT
GLOVE BIOGEL PI IND STRL 8.5 (GLOVE) ×1 IMPLANT
GLOVE SS BIOGEL STRL SZ 8.5 (GLOVE) ×1 IMPLANT
GOWN STRL REUS W/TWL 2XL LVL3 (GOWN DISPOSABLE) ×1 IMPLANT
INTRAOP MONITOR FEE IMPULS NCS (MISCELLANEOUS)
KIT BASIN OR (CUSTOM PROCEDURE TRAY) ×1 IMPLANT
KIT POSITION SURG JACKSON T1 (MISCELLANEOUS) IMPLANT
KIT TURNOVER KIT B (KITS) ×1 IMPLANT
MODULE EMG NDL SSEP NVM5 (NEUROSURGERY SUPPLIES) IMPLANT
MODULE EMG NEEDLE SSEP NVM5 (NEUROSURGERY SUPPLIES) ×2 IMPLANT
MODULE NVM5 NEXT GEN EMG (NEUROSURGERY SUPPLIES) IMPLANT
NDL 22X1.5 STRL (OR ONLY) (MISCELLANEOUS) IMPLANT
NDL SPNL 18GX3.5 QUINCKE PK (NEEDLE) ×1 IMPLANT
NEEDLE 22X1.5 STRL (OR ONLY) (MISCELLANEOUS) IMPLANT
NEEDLE SPNL 18GX3.5 QUINCKE PK (NEEDLE) ×1 IMPLANT
NS IRRIG 1000ML POUR BTL (IV SOLUTION) ×1 IMPLANT
PACK LAMINECTOMY ORTHO (CUSTOM PROCEDURE TRAY) ×1 IMPLANT
PACK UNIVERSAL I (CUSTOM PROCEDURE TRAY) ×1 IMPLANT
PAD ARMBOARD 7.5X6 YLW CONV (MISCELLANEOUS) ×2 IMPLANT
PATTIES SURGICAL .5 X.5 (GAUZE/BANDAGES/DRESSINGS) IMPLANT
PATTIES SURGICAL .5 X1 (DISPOSABLE) ×1 IMPLANT
POSITIONER HEAD PRONE TRACH (MISCELLANEOUS) ×1 IMPLANT
PROBE BALL TIP NVM5 SNG USE (NEUROSURGERY SUPPLIES) IMPLANT
PUTTY DBM INSTAFILL CART 5CC (Putty) IMPLANT
ROD RELINE 5.0X45MM (Rod) IMPLANT
ROD RELINE COCR LORD 5X40MM (Rod) IMPLANT
SCREW LOCK RSS 4.5/5.0MM (Screw) IMPLANT
SCREW SHANK RELINE MOD 5.5X35 (Screw) IMPLANT
SHANK RELINE MOD 5.5X40 (Screw) IMPLANT
SPONGE SURGIFOAM ABS GEL 100 (HEMOSTASIS) ×1 IMPLANT
SPONGE T-LAP 4X18 ~~LOC~~+RFID (SPONGE) ×3 IMPLANT
SURGIFLO W/THROMBIN 8M KIT (HEMOSTASIS) ×1 IMPLANT
SUT BONE WAX W31G (SUTURE) ×1 IMPLANT
SUT MNCRL AB 3-0 PS2 27 (SUTURE) ×2 IMPLANT
SUT MNCRL+ AB 3-0 CT1 36 (SUTURE) ×1 IMPLANT
SUT STRATAFIX 1PDS 45CM VIOLET (SUTURE) IMPLANT
SUT VIC AB 0 CT1 27 (SUTURE) ×2
SUT VIC AB 0 CT1 27XBRD ANBCTR (SUTURE) IMPLANT
SUT VIC AB 0 CT1 27XBRD ANTBC (SUTURE) ×1 IMPLANT
SUT VIC AB 1 CT1 18XCR BRD 8 (SUTURE) ×1 IMPLANT
SUT VIC AB 1 CT1 8-18 (SUTURE)
SUT VIC AB 2-0 CT1 18 (SUTURE) ×1 IMPLANT
SYR BULB IRRIG 60ML STRL (SYRINGE) ×1 IMPLANT
SYR CONTROL 10ML LL (SYRINGE) ×1 IMPLANT
TIP CONICAL INSTAFILL (ORTHOPEDIC DISPOSABLE SUPPLIES) IMPLANT
TOWEL GREEN STERILE (TOWEL DISPOSABLE) ×1 IMPLANT
TOWEL GREEN STERILE FF (TOWEL DISPOSABLE) ×1 IMPLANT
TRAY FOLEY MTR SLVR 16FR STAT (SET/KITS/TRAYS/PACK) ×1 IMPLANT
WATER STERILE IRR 1000ML POUR (IV SOLUTION) ×1 IMPLANT
YANKAUER SUCT BULB TIP NO VENT (SUCTIONS) ×1 IMPLANT

## 2023-07-08 NOTE — Anesthesia Procedure Notes (Signed)
Procedure Name: Intubation Date/Time: 07/08/2023 3:23 PM  Performed by: Thomasene Ripple, CRNAPre-anesthesia Checklist: Patient identified, Emergency Drugs available, Suction available and Patient being monitored Patient Re-evaluated:Patient Re-evaluated prior to induction Oxygen Delivery Method: Circle System Utilized Preoxygenation: Pre-oxygenation with 100% oxygen Induction Type: IV induction Ventilation: Mask ventilation without difficulty and Oral airway inserted - appropriate to patient size Laryngoscope Size: Miller and 3 Grade View: Grade II Tube type: Oral Tube size: 7.0 mm Number of attempts: 1 Airway Equipment and Method: Stylet and Oral airway Placement Confirmation: ETT inserted through vocal cords under direct vision, positive ETCO2 and breath sounds checked- equal and bilateral Secured at: 21 cm Tube secured with: Tape Dental Injury: Teeth and Oropharynx as per pre-operative assessment

## 2023-07-08 NOTE — Op Note (Signed)
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  Lumbar radiculopathy.  POST-OPERATIVE DIAGNOSIS: Same  PROCEDURE:   Revision lumbar decompression and TLIF L4-5.  SURGEON:  Venita Lick, MD  PHYSICIAN ASSISTANT: Luther Bradley  ANESTHESIA:   General  EBL: 200 ml   Complications: None  Implants: Globus modular expandable TLIF cage.  7 to 12 mm, 24 mm length.  Expanded to 11 mm for 5 degrees of lordosis.  NuVasive cortical pedicle screws: L4: 5.5 x 40 mm length screws.  L5: 5.5 x 35 mm length screws.  40 mm length rod on the right and 45 mm length rod on the left.  Neuromonitoring: All 4 pedicle screws were directly stimulated.  Left side: L4 and L5: Positive activity at 18 mA.  Left side: Positive activity 40 mA.  No adverse free running EMG and SSEP activity throughout the case and at the conclusion.  Graft: Autograft from decompression, and DBX  BRIEF HISTORY: Sonya Watson is a 57 y.o. female who had a previous lumbar discectomy approximately 4 months ago who initially did well and then had significant recurrent back buttock and radicular leg pain.  Imaging demonstrated a recurrent large posterior lateral disc herniation with advanced degenerative disc disease.  Attempts at conservative management had failed to alleviate her symptoms and so we elected to move forward with surgery.  All appropriate risks benefits and alternatives were discussed with the patient and her husband and consent was obtained.  PROCEDURE DETAILS: Patient was brought into the operating room and was properly positioned on the operating room table.  After induction with general anesthesia the patient was endotracheally intubated.  A timeout was taken to confirm all important data: including patient, procedure, and the level. Teds, SCD's  were applied.   A Foley was placed by the nurse and the neuromonitoring representative placed all appropriate needles and patches for intraoperative SSEP and EMG monitoring.  The patient was then turned prone onto the [facility] spine frame.  All once all bony promises well-padded the back was then prepped and draped in a standard fashion.  The previous incision was infiltrated with quarter percent Marcaine and reincised and extended cranially and caudally.  Sharp dissection was carried out down to the deep fascia.  As was seen on the preoperative MRI there was a seroma that we encountered.  It did not appear to be infected.  There is no purulent material.  I then continued down to the deep fascia.  I incised the deep fascia and stripped the paraspinal muscles to expose the L3, and L4 spinous processes as well as the L3-4 and L4-5 facet complexes.  Care was taken not to violate the L3-4 facet capsule.  There was a significant amount of scar tissue that was encountered on the right side from the previous surgery.  I carefully identified the remaining portion of the spinous process of L4 which allowed me to remove the scar tissue with a curette.  I then identified the facet complex and the pars.  Once I had removed the bulk of the scar tissue and I had exposed the bony anatomy I then proceeded with the instrumentation.  A high-speed bur was placed on the inferior medial corner of the pedicle of L4.  I confirmed satisfactory positioning with AP fluoroscopy.  Once I confirm this I breach the cortical bone and then placed  the pedicle awl.  I advanced the pedicle awl towards the superior lateral corner of the pedicle.  I confirmed trajectory and positioning while advancing it with fluoroscopy.  As I was in the lateral aspect of the pedicle on the AP view I confirmed that I was just beyond the posterior wall of the vertebral body on the lateral view.  Once I had the bony canal created I removed the awl and palpated the  hole with a ball-tipped feeler.  Once I confirmed that it was intact I then placed the tap to tap the hole.  After removing the tap I repalpated the hole with a ball-tipped feeler and confirmed that it was intact.  I then placed the screw which had excellent purchase.  Using this exact same technique I placed my screws at L5 and on the contralateral L4 and L5 levels.  Once all 4 pedicle screws were properly positioned I confirmed satisfactory positioning with fluoroscopy.  I then directly stimulated each of the pedicle screws and there was no activity to suggest breach of the pedicle with nerve compression/irritation.  With the pedicle screw fixation complete I proceeded with the decompression.  The interspinous process was taken down and I remove the inferior third of the spinous process of L4.  I then used an osteotome to resect the inferior L4 facet in its entirety.  I then placed a lamina spreader in the interspinous process area and distracted the intervertebral space.  This allowed me to identify the plane between the bone and scar tissue.  I then extended the right sided laminotomy superiorly.  Once this was done I could visualize the intact ligamentum flavum.  Now having my plane I could remove the bulk of the scar tissue with a double-action Leksell rongeur.  Once the scar tissue was removed I continued resecting the ligamentum flavum and ultimately the pars.  I then gently dissected along the medial aspect of the superior L5 facet until I could create a plane and I then performed a medial facetectomy.  At this point I now had the pars removed as well as the facet complex on the right side at L4-5.  The posterior annulus now came into view and there was a posterior lateral disc herniation that I could clearly see.  There was significant epidural veins that had been compressed around this area.  In order to better visualize a disc herniation I returned to the central portion and identified the intact  ligamentum flavum and developed a plane between the thecal sac and the ligamentum flavum.  Using a Kerrison rongeurs I carefully resected the ligamentum flavum.  It was adherent to the thecal sac and so extra time and vigilance was used to prevent a traumatic durotomy.  Once I had the majority of the scar tissue and the ligamentum flavum removed I could now identify the plane between the thecal sac and the disc herniation.  An annulotomy was performed and I began removing disc fragments.  Initially it was more of a mucous type cyst material that was in the disc fragment.  Ultimately though 3 large fragments of disc material were excised.  Once this was done I could now freely pass my nerve hook into the lateral recess along the medial border of the L5 pedicle and into the L5 foramen.  I began completing my discectomy at this point.  Pituitary rongeurs curettes and Kerrison rongeurs were used to remove all of the intervertebral disc material.  I confirmed I was scraping  along the subchondral bone and I was well across the midline.  Once I had an adequate discectomy I trialed the intervertebral spacers and elected to use the expandable 7 x 24.  At this point I irrigated the wound copiously normal saline and my autograft bone.  The bone was placed along the anterior annulus and on the lateral aspect of the disc space.  I then inserted the cage in an oblique fashion. Once it was countersunk below the posterior aspect of the vertebral body I then expanded it to the appropriate height.  Additional bone graft bone graft was placed along the lateral the cage.    Hemostasis was obtained using bipolar cautery and Floseal.  The polyaxial heads were then secured to the pedicle screws and the appropriate size rod was placed and locked into position with the locking caps.  On the left-hand side a high-speed bur was used to decorticate the intact L4-5 facet and posterior lateral gutter.  Polyaxial heads were placed in the  remaining bone graft was placed in the posterior lateral gutter.  The rod was then secured to the pedicle screws with the locking caps.  All locking caps were final torqued according to manufacture standards.  After additional irrigation and confirming hemostasis I then used my nerve hook to confirm satisfactory decompression.  I could easily sweep underneath the thecal sac along the posterior annulus and inferiorly along the path of the L5 nerve root into the L5 foramen.  The nerve root itself was very mobile.  I also visualized the L4 nerve root and was able to track it back into the lateral recess and it was freely mobile.  A thrombin-soaked Gelfoam was then placed over the laminotomy defect and the retractors were removed.  The deep fascia was closed with an not running #1 strata fix.  I then closed the remainder in a layered fashion with a running 0 Vicryl suture, followed by interrupted 2-0 Vicryl sutures, and a 3-0 Monocryl for the skin.  Steri-Strips and dry dressings were then applied and the patient was ultimately extubated and transferred to PACU without incident.  The end of the case all needle and sponge counts were correct.  Venita Lick, MD 07/08/2023 7:28 PM  .

## 2023-07-08 NOTE — Anesthesia Postprocedure Evaluation (Signed)
Anesthesia Post Note  Patient: Therapist, nutritional  Procedure(s) Performed: TRANSFORAMINAL LUMBAR INTERBODY FUSION (TLIF) WITH PEDICLE SCREW FIXATION 1 LEVEL L4-5 (Back)     Patient location during evaluation: PACU Anesthesia Type: General Level of consciousness: awake and alert, patient cooperative and oriented Pain management: pain level controlled (pain improving) Vital Signs Assessment: post-procedure vital signs reviewed and stable Respiratory status: spontaneous breathing, nonlabored ventilation and respiratory function stable Cardiovascular status: blood pressure returned to baseline and stable Postop Assessment: no apparent nausea or vomiting Anesthetic complications: no   No notable events documented.  Last Vitals:  Vitals:   07/08/23 2045 07/08/23 2100  BP: 115/73 106/66  Pulse: 81 74  Resp: 15 12  Temp:    SpO2: 91% 92%    Last Pain:  Vitals:   07/08/23 2100  TempSrc:   PainSc: 6                  Ifeoma Vallin,E. Joaquina Nissen

## 2023-07-08 NOTE — OR Nursing (Signed)
Care of patient assumed at 1908. 

## 2023-07-08 NOTE — Transfer of Care (Signed)
Immediate Anesthesia Transfer of Care Note  Patient: Sonya Watson  Procedure(s) Performed: TRANSFORAMINAL LUMBAR INTERBODY FUSION (TLIF) WITH PEDICLE SCREW FIXATION 1 LEVEL L4-5  Patient Location: PACU  Anesthesia Type:General  Level of Consciousness: awake, alert , and drowsy  Airway & Oxygen Therapy: Patient Spontanous Breathing and Patient connected to nasal cannula oxygen  Post-op Assessment: Report given to RN, Post -op Vital signs reviewed and stable, and Patient moving all extremities X 4  Post vital signs: Reviewed and stable  Last Vitals:  Vitals Value Taken Time  BP 139/85   Temp    Pulse 89   Resp 14   SpO2 92     Last Pain:  Vitals:   07/08/23 1109  TempSrc:   PainSc: 5       Patients Stated Pain Goal: 3 (07/08/23 1109)  Complications: No notable events documented.

## 2023-07-08 NOTE — Brief Op Note (Signed)
07/08/2023  7:45 PM  PATIENT:  Sonya Watson  57 y.o. female  PRE-OPERATIVE DIAGNOSIS:  Recurrent herniated disc with radiculopathy L4-5  POST-OPERATIVE DIAGNOSIS:  * No post-op diagnosis entered *  PROCEDURE:  Procedure(s) with comments: TRANSFORAMINAL LUMBAR INTERBODY FUSION (TLIF) WITH PEDICLE SCREW FIXATION 1 LEVEL L4-5 (N/A) - 4 hrs 3 C-Bed  SURGEON:  Surgeons and Role:    Venita Lick, MD - Primary  PHYSICIAN ASSISTANT:   ASSISTANTS: Luther Bradley   ANESTHESIA:   general  EBL:  200 mL   BLOOD ADMINISTERED:none  DRAINS: none   LOCAL MEDICATIONS USED:  MARCAINE     SPECIMEN:  No Specimen  DISPOSITION OF SPECIMEN:  N/A  COUNTS:  YES  TOURNIQUET:  * No tourniquets in log *  DICTATION: .Dragon Dictation  PLAN OF CARE: Admit to inpatient   PATIENT DISPOSITION:  PACU - hemodynamically stable.

## 2023-07-08 NOTE — H&P (Signed)
History:  Sonya Watson is a very pleasant 57 year old woman with a recurrent L4-5 right-sided disc herniation causing significant back buttock and radicular right leg pain. She recently had a lumbar microdiscectomy and unfortunately had a recurrent injury. Given the repeat disc herniation and the existing degenerative disease we have elected to move forward with a TLIF. Since she has no left leg symptoms I did not recommend addressing the smaller left L5-S1 disc protrusion.  Past Medical History:  Diagnosis Date   Arthritis    High cholesterol    Hypertension    on bystolic-diagnosed htn 1 year ago   Pre-diabetes    Seasonal allergies     Allergies  Allergen Reactions   Clindamycin/Lincomycin Itching and Swelling    Hand swelling and itching    Erythromycin Other (See Comments)    Stomach cramps    No current facility-administered medications on file prior to encounter.   Current Outpatient Medications on File Prior to Encounter  Medication Sig Dispense Refill   bisacodyl (DULCOLAX) 5 MG EC tablet Take 5 mg by mouth daily as needed for moderate constipation.     celecoxib (CELEBREX) 200 MG capsule Take 200 mg by mouth 2 (two) times daily as needed for moderate pain.     Cholecalciferol (VITAMIN D3 PO) Take 1 tablet by mouth daily.     gabapentin (NEURONTIN) 300 MG capsule Take 300 mg by mouth 3 (three) times daily as needed (pain).     hydrOXYzine (ATARAX) 25 MG tablet Take 25 mg by mouth 4 (four) times daily as needed for itching.     levocetirizine (XYZAL) 5 MG tablet Take 5 mg by mouth at bedtime.     losartan (COZAAR) 25 MG tablet Take 25 mg by mouth daily.     nebivolol (BYSTOLIC) 10 MG tablet Take 10 mg by mouth every evening.     oxyCODONE (OXY IR/ROXICODONE) 5 MG immediate release tablet Take 1 tablet (5 mg total) by mouth every 4 (four) hours as needed for severe pain. (Patient taking differently: Take 5-10 mg by mouth See admin instructions. Take 10 mg by mouth every 5  hours and take 5 mg nightly) 40 tablet 0   QUERCETIN PO Take 1 capsule by mouth at bedtime. For vitamin C     docusate sodium (COLACE) 100 MG capsule Take 1 capsule (100 mg total) by mouth 2 (two) times daily as needed for mild constipation. (Patient not taking: Reported on 06/28/2023) 30 capsule 1   methocarbamol (ROBAXIN) 500 MG tablet Take 1 tablet (500 mg total) by mouth every 8 (eight) hours as needed for muscle spasms. (Patient not taking: Reported on 06/28/2023) 30 tablet 1   OZEMPIC, 1 MG/DOSE, 4 MG/3ML SOPN Inject 1 mg into the skin once a week.     polyethylene glycol (MIRALAX / GLYCOLAX) 17 g packet Take 17 g by mouth daily. (Patient not taking: Reported on 06/28/2023) 14 each 0    Physical Exam: Clinical exam: Sonya Watson is a pleasant individual, who appears younger than their stated age.  She is alert and orientated 3.  No shortness of breath, chest pain.  Abdomen is soft and non-tender, negative loss of bowel and bladder control, no rebound tenderness.  Patient had a recent fall with abrasion/contusion over the left eye and jaw.  Peripheral pulses: 2+ peripheral pulses bilaterally. LE compartments are: Soft and nontender.  Gait pattern: Altered unsteady gait pattern due to severe radicular leg pain and right foot drop.  Assistive devices: Environmental consultant  Neuro: 5/5 motor strength in the left lower extremity. 5/5 right hip flexor and quad strength. 4/5 EHL/tibialis anterior strength. 5/5 gastrocnemius. Positive right straight leg raise test. Negative Babinski test, no clonus. Decreased sensation to light touch in the right L4 and L5 dermatome.  Musculoskeletal: Significant back pain with palpation and range of motion. Well-healed surgical scar from prior L4-5 discectomy.  Imaging: X-rays of the lumbar spine demonstrate degenerative lumbar disc disease L5-S1 with moderate facet arthropathy. No spondylolisthesis.  Lumbar MRI with contrast: completed on 05/25/2023. No disc herniation or  stenosis L1-4. Right previous hemilaminotomy at L4-5. Positive vacuum phenomenon within the L4-5 disc space. No psoas collection or evidence of epidural abscess. No signs consistent with discitis. Large recurrent right-sided disc herniation with cephalad and cranial migration. Cephalad migration tracks into the L4 foramen on the right side. There is significant displacement of the traversing right L5 nerve root and exiting L4 nerve root in the foramen. There is a small left paracentral disc protrusion at L5-S1 which is not changed from her prior. Degenerative disc disease at L5-S1 unchanged from prior imaging study. Patient also has a subcutaneous seroma.  A/P: Sonya Watson is a very pleasant 57 year old woman who had a lumbar microdiscectomy for back buttock and radicular leg pain. While she did well initially she then had sudden recurrent back buttock and radicular leg pain. Repeat imaging showed a recurrent disc herniation that is larger in size and causing more significant stenosis. In addition to the lateral recess compromise there is also foraminal extension affecting the exiting L4 nerve root. While there is a left-sided disc protrusion at L5-S1 the patient does not clinically have complaints of left radicular leg pain nor does this disc protrusion appeared to have increased in size when compared to her preoperative MRI. As result I believe the principal issue is the recurrent L4-5 disc herniation. The patient has neurological deficits, difficulty walking, and has not improved with injection therapy and opioid medications. Given the fact she has progressive neurological deficits and significant pain I am recommending a revision surgery. To address both the foraminal and lateral recess compromise created by the recurrent disc herniation I am recommending a Gill decompression. This would allow adequate visualization of the affected area. Given the fact she has had a recent surgery more than likely a more  extensive decompression will be required. This extensive decompression will then lead to instability and the need for instrumentation. Therefore I am planning on moving forward with a TLIF at L4-5. I have gone over the surgical procedure in great detail with the patient and her husband including the risks, benefits, and alternatives. She and her husband have expressed an understanding of these risks and a willingness to move forward with surgery. The goal of surgery is to reduce her pain and improve her quality of life. I did indicate that since this is a revision that there is increased chance of poor outcome, leak of spinal fluid, and other complications Risks and benefits of spinal fusion: Infection, bleeding, death, stroke, paralysis, ongoing or worse pain, need for additional surgery, nonunion, leak of spinal fluid, adjacent segment degeneration requiring additional fusion surgery, Injury to abdominal vessels that can require anterior surgery to stop bleeding. Malposition of the cage and/or pedicle screws that could require additional surgery. Loss of bowel and bladder control. Postoperative hematoma causing neurologic compression that could require urgent or emergent re-operation.

## 2023-07-09 MED ORDER — MAGNESIUM CITRATE PO SOLN
1.0000 | Freq: Once | ORAL | Status: AC
  Administered 2023-07-09: 1 via ORAL
  Filled 2023-07-09: qty 296

## 2023-07-09 MED ORDER — SENNOSIDES-DOCUSATE SODIUM 8.6-50 MG PO TABS
1.0000 | ORAL_TABLET | Freq: Two times a day (BID) | ORAL | Status: DC
  Administered 2023-07-09 – 2023-07-10 (×4): 1 via ORAL
  Filled 2023-07-09 (×6): qty 1

## 2023-07-09 MED FILL — Thrombin For Soln Kit 20000 Unit: CUTANEOUS | Qty: 1 | Status: AC

## 2023-07-09 NOTE — Evaluation (Signed)
Physical Therapy Evaluation Patient Details Name: Sonya Watson MRN: [id number] DOB: 10/08/1965 Today's Date: 07/09/2023  History of Present Illness  57 y.o. female presenting for surgery with a dx of herniated disc, stenosis L4-L5 right with pain radiating down RLE. Pt is now s/p Hemi laminotomy microdiscectomy L4-5 right (Right) 04/01/23. PMH of Arthritis, HTN, pre-diabetes  Clinical Impression  Pt is presenting close to baseline. Pt is currently mod I with all functional mobility with good technique performing log roll for bed mobility and use of RW. Due to pt current functional status, home set up and available assistance no recommended skilled physical therapy services at this time. Pt will be discharged from acute care physical therapy; please re-consult if further needs arise.               Equipment Recommendations None recommended by PT     Functional Status Assessment Patient has not had a recent decline in their functional status     Precautions / Restrictions Precautions Precautions: Back Precaution Booklet Issued: Yes (comment) Precaution Comments: OT provided booklet. Went over precautions. pt is very knowledgeable about precautions Required Braces or Orthoses: Spinal Brace Spinal Brace: Lumbar corset Restrictions Weight Bearing Restrictions: No      Mobility  Bed Mobility Overal bed mobility: Modified Independent             General bed mobility comments: Pt has good technique with log roll    Transfers Overall transfer level: Modified independent Equipment used: Rolling walker (2 wheels)      Ambulation/Gait Ambulation/Gait assistance: Modified independent (Device/Increase time) Gait Distance (Feet): 100 Feet Assistive device: Rolling walker (2 wheels) Gait Pattern/deviations: Step-through pattern, Decreased stride length Gait velocity: decreased Gait velocity interpretation: 1.31 - 2.62 ft/sec, indicative of limited community ambulator       Stairs Stairs:  (not applicable pt has a ramp)              Balance Overall balance assessment: No apparent balance deficits (not formally assessed)         Pertinent Vitals/Pain Pain Assessment Faces Pain Scale: Hurts a little bit Pain Location: incision site Pain Descriptors / Indicators: Aching, Burning Pain Intervention(s): Monitored during session    Home Living Family/patient expects to be discharged to:: Private residence Living Arrangements: Spouse/significant other Available Help at Discharge: Family;Available PRN/intermittently Type of Home: House Home Access: Ramped entrance       Home Layout: Two level;Able to live on main level with bedroom/bathroom Home Equipment: Shower seat - built Charity fundraiser (2 wheels);Toilet riser (spouse to buy suction cup)      Prior Function Prior Level of Function : Needs assist             Mobility Comments: pt reports using RW and then progressed to nothing and fall results from catching toe ( not clearing foot) ADLs Comments: house helps iwth all showers due to fear of falling     Extremity/Trunk Assessment   Upper Extremity Assessment Upper Extremity Assessment: Overall WFL for tasks assessed    Lower Extremity Assessment Lower Extremity Assessment: Overall WFL for tasks assessed RLE Deficits / Details: some weakness in the R anterior tibialis and hip flexors which with fatigue cause toe drag    Cervical / Trunk Assessment Cervical / Trunk Assessment: Back Surgery  Communication   Communication Communication: No apparent difficulties  Cognition Arousal: Alert Behavior During Therapy: [employer] for tasks assessed/performed      General Comments General comments (skin integrity, edema, etc.): No  noted drainage from incision.        Assessment/Plan    PT Assessment All further PT needs can be met in the next venue of care  PT Problem List Decreased mobility           PT Goals (Current goals  can be found in the Care Plan section)  Acute Rehab PT Goals PT Goal Formulation: All assessment and education complete, DC therapy            AM-PAC PT "6 Clicks" Mobility  Outcome Measure Help needed turning from your back to your side while in a flat bed without using bedrails?: None Help needed moving from lying on your back to sitting on the side of a flat bed without using bedrails?: None Help needed moving to and from a bed to a chair (including a wheelchair)?: None Help needed standing up from a chair using your arms (e.g., wheelchair or bedside chair)?: None Help needed to walk in hospital room?: None Help needed climbing 3-5 steps with a railing? : None 6 Click Score: 24    End of Session Equipment Utilized During Treatment: Back brace Activity Tolerance: Patient tolerated treatment well Patient left: in bed;with call bell/phone within reach Nurse Communication: Mobility status      Time: 1610-9604 PT Time Calculation (min) (ACUTE ONLY): 24 min   Charges:   PT Evaluation $PT Eval Low Complexity: 1 Low PT Treatments $Therapeutic Activity: 8-22 mins PT General Charges $$ ACUTE PT VISIT: 1 Visit         Harrel Carina, DPT, CLT  Acute Rehabilitation Services Office: [phone] (Secure chat preferred)   Claudia Desanctis 07/09/2023, 11:39 AM

## 2023-07-09 NOTE — Progress Notes (Signed)
OT NOTE Patient evaluated by Occupational Therapy with no further acute OT needs identified. All education has been completed and the patient has no further questions. See below for any follow-up Occupational Therapy or equipment needs. OT to sign off. Thank you for referral.    07/09/23 0900  OT Visit Information  Last OT Received On 07/09/23  Assistance Needed +1  History of Present Illness 57 yo female s/p TLIF with pedicule screw fixation L4-5 PMH arthritis, HTN, 6/27 laminotomy  Precautions  Precautions Fall;Back  Precaution Comments reviewed back precautions  Required Braces or Orthoses Spinal Brace  Spinal Brace Lumbar corset;Applied in sitting position  Home Living  Family/patient expects to be discharged to: Private residence  Living Arrangements Spouse/significant other  Available Help at Discharge Family;Available PRN/intermittently  Type of Home House  Home Access Ramped entrance  Home Layout Two level;Able to live on main level with bedroom/bathroom  Scientist, physiological Yes  Home Equipment Shower seat - built [employer] (2 wheels);Toilet riser (spouse to buy suction cup)  Prior Function  Prior Level of Function  Needs assist  Mobility Comments pt reports using RW and then progressed to nothing and fall results from catching toe ( not clearing foot)  ADLs Comments house helps iwth all showers due to fear of falling  Pain Assessment  Pain Assessment Faces  Faces Pain Scale 4  Pain Location back  Pain Descriptors / Indicators Discomfort  Pain Intervention(s) Monitored during session;Repositioned;Premedicated before session  Cognition  Arousal Alert  Behavior During Therapy [facility] for tasks assessed/performed  Overall Cognitive Status Within Functional Limits for tasks assessed  Communication  Communication No apparent difficulties  Upper Extremity Assessment  Upper Extremity Assessment Overall WFL  for tasks assessed  Lower Extremity Assessment  Lower Extremity Assessment Defer to PT evaluation;RLE deficits/detail  RLE Deficits / Details reports R LE is weakest  Cervical / Trunk Assessment  Cervical / Trunk Assessment Back Surgery  Vision- History  Baseline Vision/History 1 Wears glasses  Ability to See in Adequate Light 0 Adequate  Patient Visual Report No change from baseline  Vision- Assessment  Vision Assessment? No apparent visual deficits  ADL  Overall ADL's  Needs assistance/impaired  Eating/Feeding Modified independent  Grooming Wash/dry hands;Modified independent  Upper Body Bathing Set up  Lower Body Bathing Set up  Upper Body Dressing  Set up  Lower Body Dressing Set up  Toilet Transfer Set up;Rolling walker (2 wheels);BSC/3in1;Ambulation  Functional mobility during ADLs Supervision/safety;Rolling walker (2 wheels)  Bed Mobility  Overal bed mobility Modified Independent  Transfers  Overall transfer level Needs assistance  Transfers Sit to/from Stand  Sit to Stand Supervision  Balance  Overall balance assessment Mild deficits observed, not formally tested  OT - End of Session  Equipment Utilized During Treatment Rolling walker (2 wheels);Back brace  Activity Tolerance Patient tolerated treatment well  Patient left Other (comment) (up in the hall with PT)  Nurse Communication Mobility status;Precautions  OT Assessment  OT Recommendation/Assessment Patient does not need any further OT services  OT Visit Diagnosis Unsteadiness on feet (R26.81)  AM-PAC OT "6 Clicks" Daily Activity Outcome Measure (Version 2)  Help from another person eating meals? 4  Help from another person taking care of personal grooming? 4  Help from another person toileting, which includes using toliet, bedpan, or urinal? 4  Help from another person bathing (including washing, rinsing, drying)? 4  Help from another person to put on and taking  off regular upper body clothing? 4  Help from  another person to put on and taking off regular lower body clothing? 4  6 Click Score 24  Progressive Mobility  What is the highest level of mobility based on the progressive mobility assessment? Level 5 (Walks with assist in room/hall) - Balance while stepping forward/back and can walk in room with assist - Complete  Activity Ambulated with assistance in hallway;Ambulated with assistance in room  OT Recommendation  Follow Up Recommendations No OT follow up  Functional Status Assessent Patient has had a recent decline in their functional status and demonstrates the ability to make significant improvements in function in a reasonable and predictable amount of time.  OT Equipment None recommended by OT  Acute Rehab OT Goals  Patient Stated Goal to go home  Potential to Achieve Goals Good  OT Time Calculation  OT Start Time (ACUTE ONLY) 1610  OT Stop Time (ACUTE ONLY) 0901  OT Time Calculation (min) 29 min  OT General Charges  $OT Visit 1 Visit  OT Evaluation  $OT Eval Moderate Complexity 1 Mod   Brynn, OTR/L  Acute Rehabilitation Services Office: [phone] .

## 2023-07-09 NOTE — Progress Notes (Signed)
    Subjective: Procedure(s) (LRB): TRANSFORAMINAL LUMBAR INTERBODY FUSION (TLIF) WITH PEDICLE SCREW FIXATION 1 LEVEL L4-5 (N/A) 1 Day Post-Op  Patient reports pain as 2 on 0-10 scale.  Reports decreased leg pain reports incisional back pain   Positive void Negative bowel movement Positive flatus Negative chest pain or shortness of breath  Objective: Vital signs in last 24 hours: Temp:  [98.1 F (36.7 C)-99.2 F (37.3 C)] 99.2 F (37.3 C) (10/04 0802) Pulse Rate:  [73-86] 77 (10/04 0802) Resp:  [12-20] 20 (10/04 0802) BP: (101-139)/(58-85) 114/58 (10/04 0802) SpO2:  [91 %-99 %] 96 % (10/04 0802) Weight:  [109.3 kg] 109.3 kg (10/03 1051)  Intake/Output from previous day: 10/03 0701 - 10/04 0700 In: 2640 [P.O.:240; I.V.:2400] Out: 2050 [Urine:1850; Blood:200]  Labs: Recent Labs    07/08/23 1248  HCT 40.0   Recent Labs    07/08/23 1248  NA 137  K 3.7  CL 105  BUN 10  CREATININE 0.80  GLUCOSE 89   No results for input(s): "LABPT", "INR" in the last 72 hours.  Physical Exam: Neurologically intact ABD soft Intact pulses distally Dorsiflexion/Plantar flexion intact Incision: dressing C/D/I and no drainage Compartment soft Body mass index is 38.9 kg/m.   Assessment/Plan: Patient stable  Continue mobilization with physical therapy Continue care  Patient doing well overall.  Radicular leg pain and weakness improved. Patient will work with therapy today and plan on d/c in the AM  Venita Lick, MD [facility] [phone]

## 2023-07-10 NOTE — Plan of Care (Signed)

## 2023-07-10 NOTE — Progress Notes (Signed)
Sonya Watson  MRN: [id number] DOB/Age: Aug 17, 1966 57 y.o. Physician: Lynnea Maizes, M.D. 2 Days Post-Op Procedure(s) (LRB): TRANSFORAMINAL LUMBAR INTERBODY FUSION (TLIF) WITH PEDICLE SCREW FIXATION 1 LEVEL L4-5 (N/A)  Subjective: Patient laying in bed this a.m. reports being fatigued after therapy session.  Reports continued moderate low back pain although in general reports feeling better than yesterday.  Reports no BM yet. Vital Signs Temp:  [97.8 F (36.6 C)-98.7 F (37.1 C)] 98.1 F (36.7 C) (10/05 0921) Pulse Rate:  [66-73] 66 (10/05 0921) Resp:  [16-20] 16 (10/05 0921) BP: (104-136)/(51-67) 126/55 (10/05 0921) SpO2:  [94 %-100 %] 97 % (10/05 0921)  Lab Results Recent Labs    07/08/23 1248  HGB 13.6  HCT 40.0   BMET Recent Labs    07/08/23 1248  NA 137  K 3.7  CL 105  GLUCOSE 89  BUN 10  CREATININE 0.80   No results found for: "INR"   Exam  Surgical dressing clean and dry.  Moves ankles and toes well with grossly nonfocal neurologic exam.  Plan Status post lumbar fusion progressing well.  Plan  Patient requested additional day for recovery prior to discharge home.  She is requesting to work with a therapist on bed mobility and transfers.  Will anticipate discharge tomorrow.  Continue post op protocol per Dr. Peggyann Juba M Malin Watson 07/10/2023, 9:43 AM   Contact # [phone]

## 2023-07-11 NOTE — Progress Notes (Signed)
Sonya Watson  MRN: [id number] DOB/Age: 1965/10/19 57 y.o. Physician: Lynnea Maizes, M.D. 3 Days Post-Op Procedure(s) (LRB): TRANSFORAMINAL LUMBAR INTERBODY FUSION (TLIF) WITH PEDICLE SCREW FIXATION 1 LEVEL L4-5 (N/A)  Subjective: Patient laying in bed this a.m. reports feeling better than yesterday.  Reports first BM yesterday late afternoon. Vital Signs Temp:  [98.1 F (36.7 C)-98.5 F (36.9 C)] 98.5 F (36.9 C) (10/06 0750) Pulse Rate:  [56-70] 68 (10/06 0750) Resp:  [16-18] 18 (10/06 0750) BP: (121-142)/(55-83) 142/80 (10/06 0750) SpO2:  [96 %-100 %] 98 % (10/06 0750)  Lab Results Recent Labs    07/08/23 1248  HGB 13.6  HCT 40.0   BMET Recent Labs    07/08/23 1248  NA 137  K 3.7  CL 105  GLUCOSE 89  BUN 10  CREATININE 0.80   No results found for: "INR"   Exam  Surgical dressing clean and dry.  Moves ankles and toes well with grossly nonfocal neurologic exam.  Plan Status post lumbar fusion progressing well.  Plan  Patient doing well enough prior to discharge home.  She is requesting to work with a therapist on bed mobility and transfers.  Will anticipate discharge today.  Continue post op protocol per Dr. Eunice Blase 07/11/2023, 9:08 AM   Contact # [phone]

## 2023-07-11 NOTE — Progress Notes (Signed)
Discharge teaching complete. Meds, diet, activity, follow up appointments reviewed and all questions answered. Copy of instructions given to patient and patient will be discharged home with husband via wheelchair.

## 2023-07-13 IMAGING — MG MM DIGITAL DIAGNOSTIC UNILAT*L* W/ TOMO W/ CAD
4 series · 4 of 12 positions shown · non-contrast
Comparison: 04/15/2021

CLINICAL DATA: Patient presents after screening study for
evaluation of possible LEFT breast asymmetry.

EXAM:
DIGITAL DIAGNOSTIC UNILATERAL LEFT MAMMOGRAM WITH TOMOSYNTHESIS AND
CAD
TECHNIQUE: Left digital diagnostic mammography and breast tomosynthesis was
performed. The images were evaluated with computer-aided detection.

[L ML synth-2D]
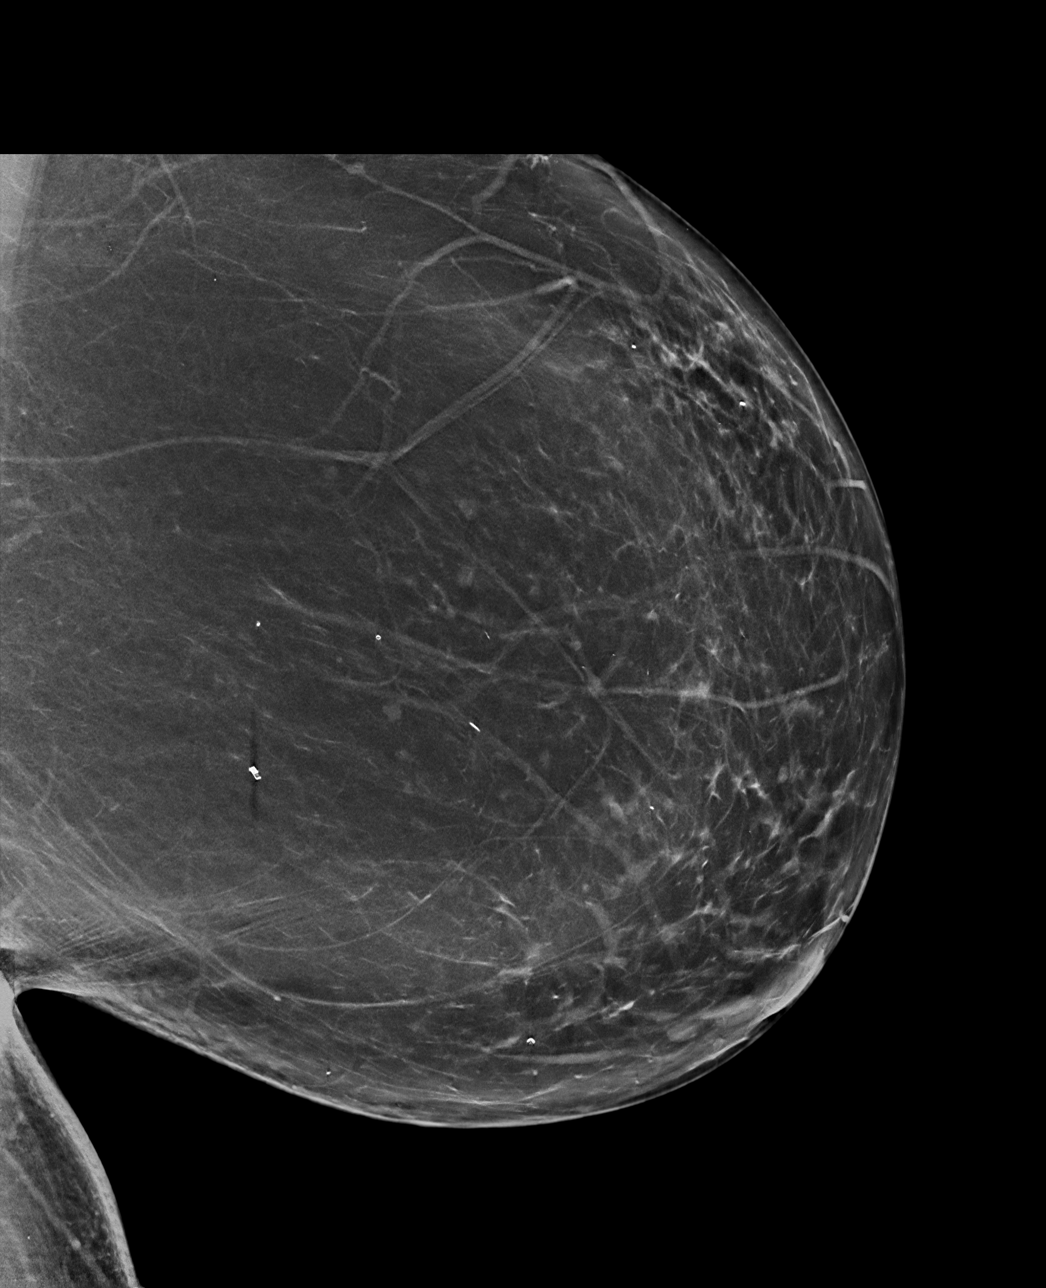

[L CC synth-2D]
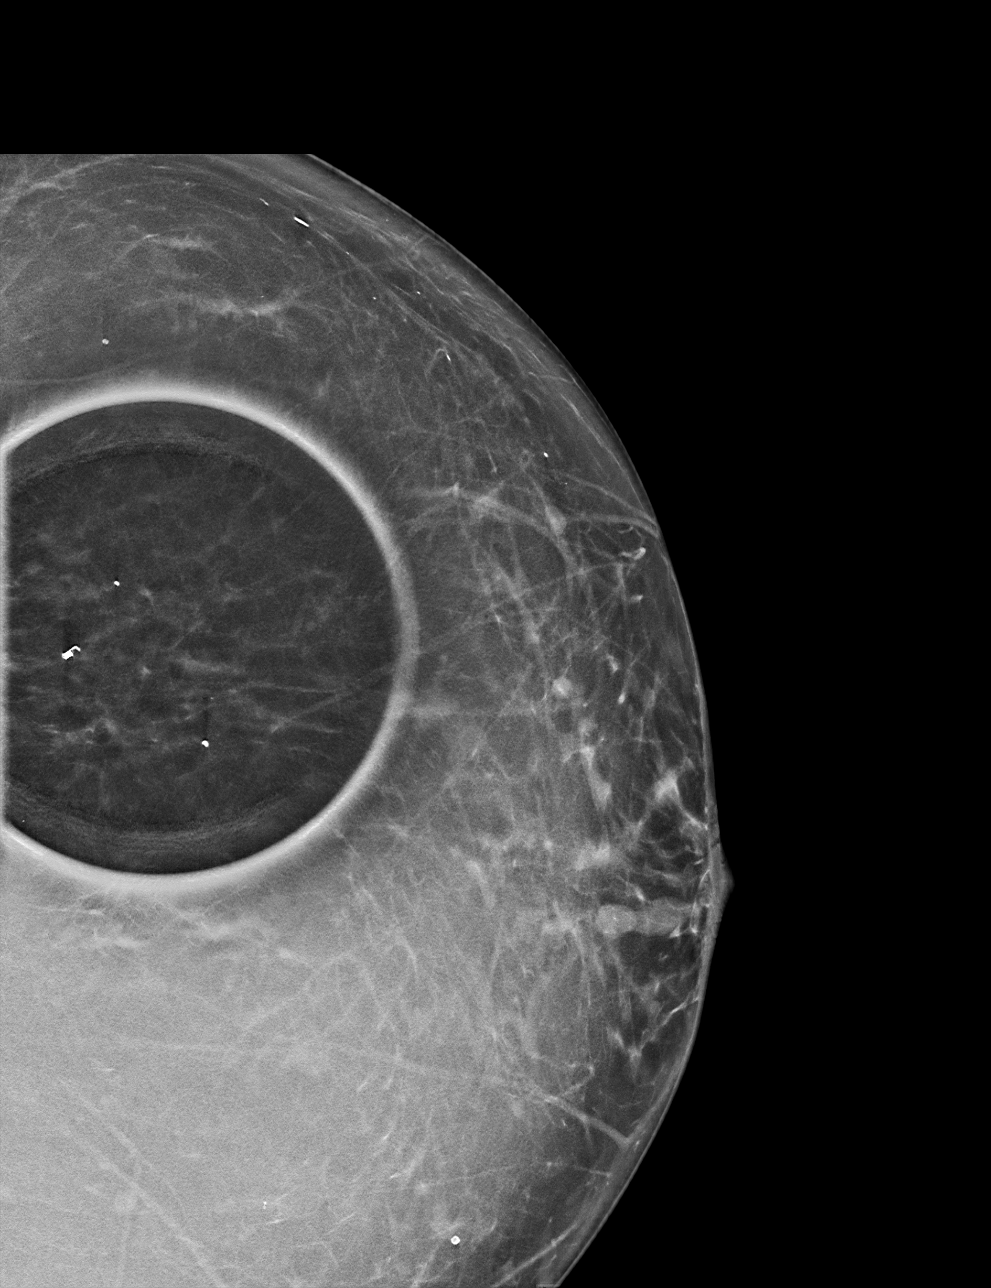

[L ML tomo · tomo slice 45/88.0]
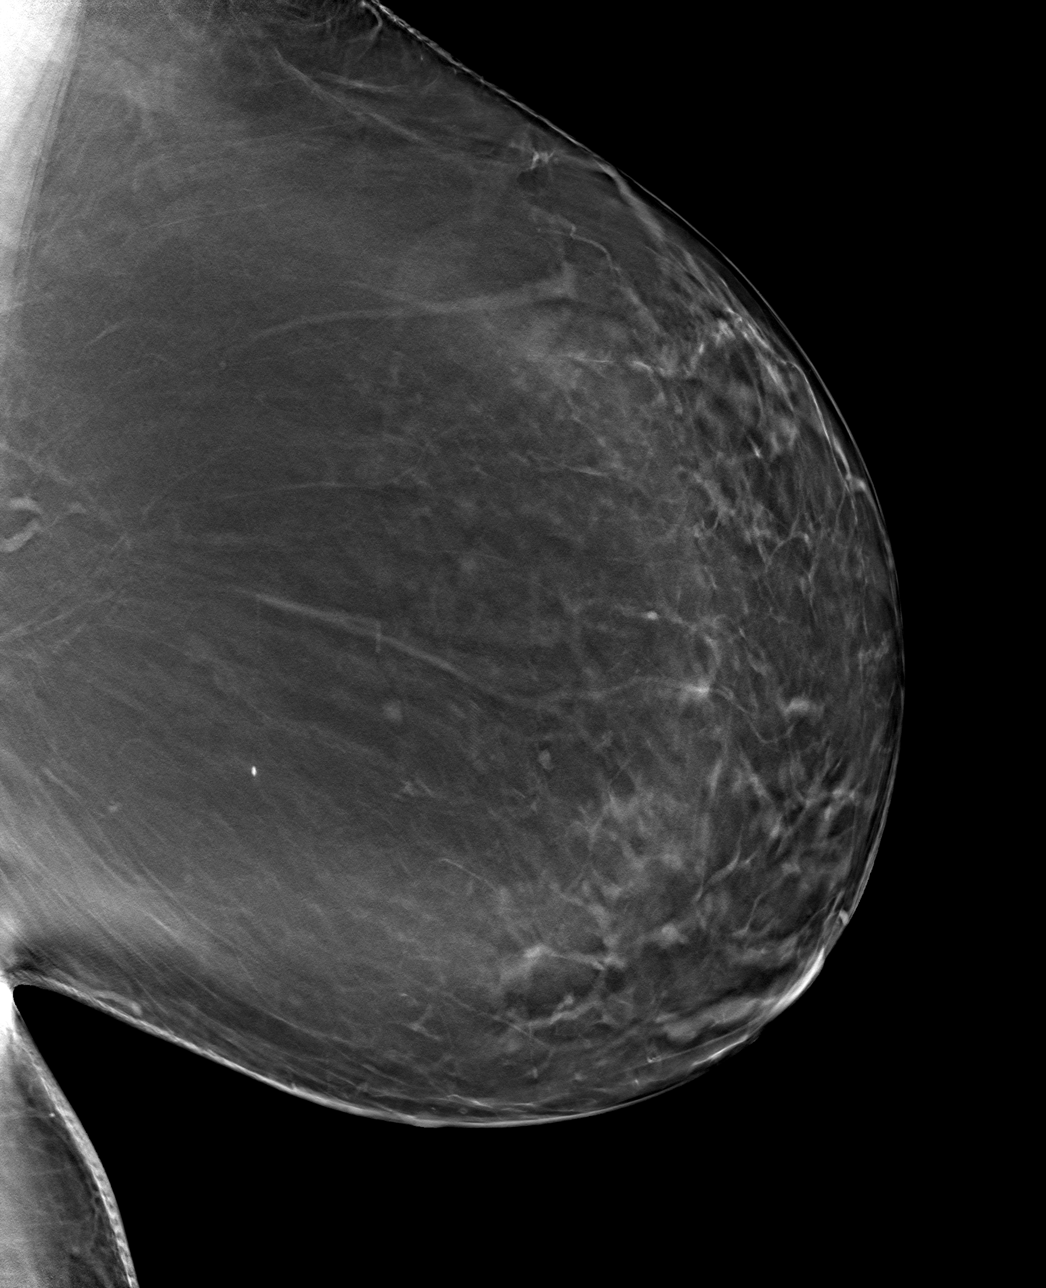

[L CC tomo · tomo slice 30/59.0]
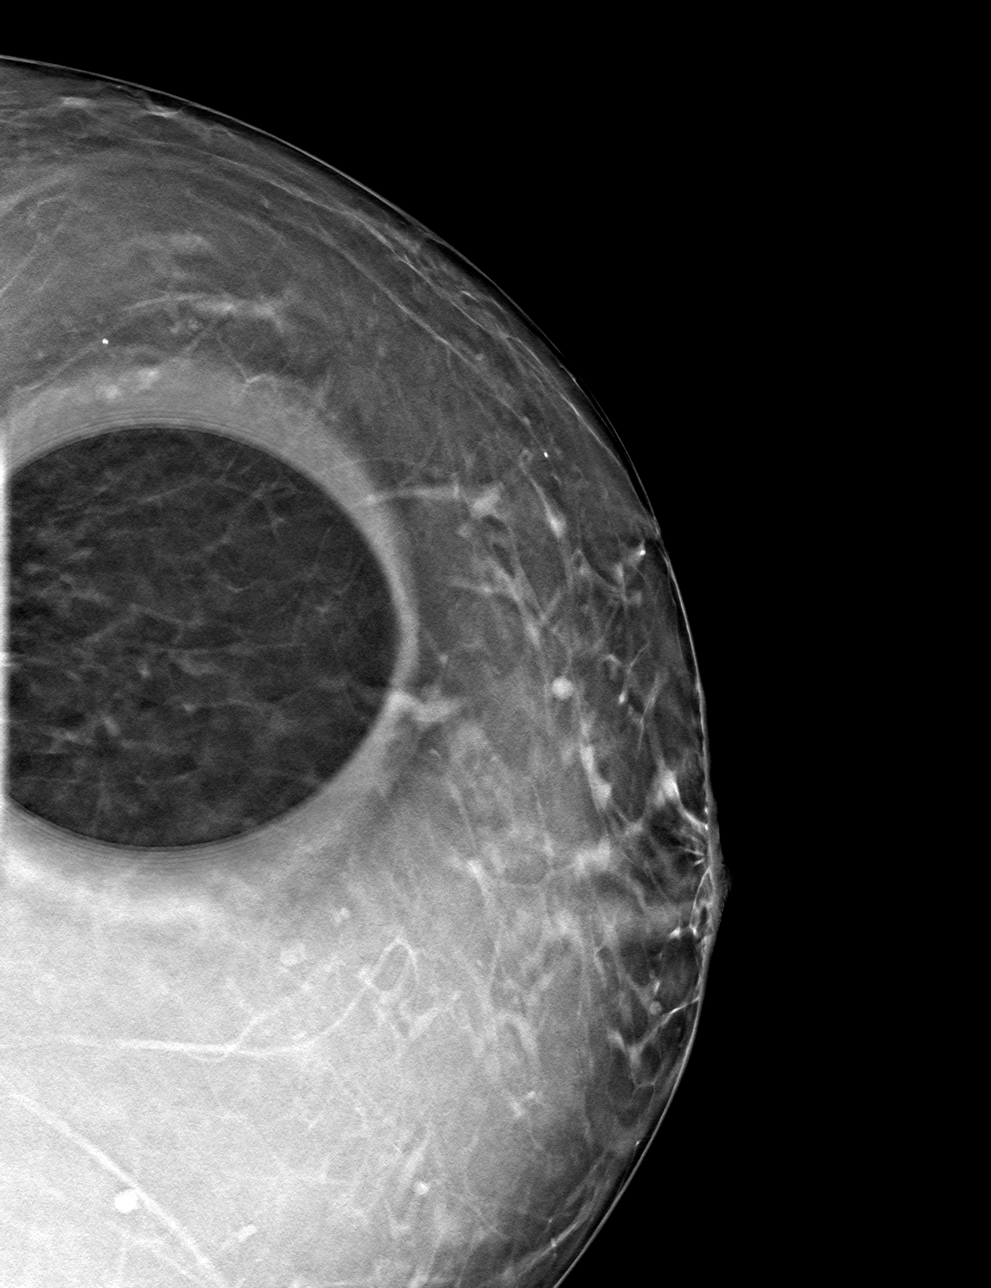

[4 of 12 positions shown; findings below may reference images not displayed]

ACR Breast Density Category b: There are scattered areas of
fibroglandular density.
FINDINGS: Additional 2-D and 3-D images are performed. These views show no
persistent asymmetry in the LATERAL portion of the LEFT breast. No
suspicious mass, distortion, or microcalcifications are identified
to suggest presence of malignancy.
IMPRESSION: No mammographic evidence for malignancy.

RECOMMENDATION:
Screening mammogram in one year.(Code:[id number]-U-[id number])

I have discussed the findings and recommendations with the patient.
If applicable, a reminder letter will be sent to the patient
regarding the next appointment.

BI-RADS CATEGORY  1: Negative.

## 2023-07-14 ENCOUNTER — Encounter (HOSPITAL_COMMUNITY): Payer: Self-pay | Admitting: Orthopedic Surgery

## 2023-07-14 NOTE — Discharge Summary (Signed)
Patient ID: Philadelphia Ransford MRN: [id number] DOB/AGE: Jul 01, 1966 57 y.o.  Admit date: 07/08/2023 Discharge date: 07/14/2023  Admission Diagnoses:  Principal Problem:   S/P lumbar fusion   Discharge Diagnoses:  Principal Problem:   S/P lumbar fusion  status post Procedure(s): TRANSFORAMINAL LUMBAR INTERBODY FUSION (TLIF) WITH PEDICLE SCREW FIXATION 1 LEVEL L4-5  Past Medical History:  Diagnosis Date   Arthritis    High cholesterol    Hypertension    on bystolic-diagnosed htn 1 year ago   Pre-diabetes    Seasonal allergies     Surgeries: Procedure(s): TRANSFORAMINAL LUMBAR INTERBODY FUSION (TLIF) WITH PEDICLE SCREW FIXATION 1 LEVEL L4-5 on 07/08/2023   Consultants:   Discharged Condition: Improved  Hospital Course: Sonya Watson is an 57 y.o. female who was admitted 07/08/2023 for operative treatment of S/P lumbar fusion. Patient failed conservative treatments (please see the history and physical for the specifics) and had severe unremitting pain that affects sleep, daily activities and work/hobbies. After pre-op clearance, the patient was taken to the operating room on 07/08/2023 and underwent  Procedure(s): TRANSFORAMINAL LUMBAR INTERBODY FUSION (TLIF) WITH PEDICLE SCREW FIXATION 1 LEVEL L4-5.    Patient was given perioperative antibiotics:  Anti-infectives (From admission, onward)    Start     Dose/Rate Route Frequency Ordered Stop   07/08/23 2115  ceFAZolin (ANCEF) IVPB 1 g/50 mL premix        1 g 100 mL/hr over 30 Minutes Intravenous Every 8 hours 07/08/23 2113 07/09/23 0608   07/08/23 1057  ceFAZolin (ANCEF) IVPB 2g/100 mL premix        2 g 200 mL/hr over 30 Minutes Intravenous 30 min pre-op 07/08/23 1057 07/08/23 1913        Patient was given sequential compression devices and early ambulation to prevent DVT.   Patient benefited maximally from hospital stay and there were no complications. At the time of discharge, the patient was urinating/moving their bowels  without difficulty, tolerating a regular diet, pain is controlled with oral pain medications and they have been cleared by PT/OT.   Recent vital signs: No data found.   Recent laboratory studies: No results for input(s): "WBC", "HGB", "HCT", "PLT", "NA", "K", "CL", "CO2", "BUN", "CREATININE", "GLUCOSE", "INR", "CALCIUM" in the last 72 hours.  Invalid input(s): "PT", "2"   Discharge Medications:   Allergies as of 07/11/2023       Reactions   Clindamycin/lincomycin Itching, Swelling   Hand swelling and itching   Erythromycin Other (See Comments)   Stomach cramps        Medication List     TAKE these medications    bisacodyl 5 MG EC tablet Commonly known as: DULCOLAX Take 5 mg by mouth daily as needed for moderate constipation.   celecoxib 200 MG capsule Commonly known as: CELEBREX Take 200 mg by mouth 2 (two) times daily as needed for moderate pain.   docusate sodium 100 MG capsule Commonly known as: Colace Take 1 capsule (100 mg total) by mouth 2 (two) times daily as needed for mild constipation.   gabapentin 300 MG capsule Commonly known as: NEURONTIN Take 300 mg by mouth 3 (three) times daily as needed (pain).   hydrOXYzine 25 MG tablet Commonly known as: ATARAX Take 25 mg by mouth 4 (four) times daily as needed for itching.   levocetirizine 5 MG tablet Commonly known as: XYZAL Take 5 mg by mouth at bedtime.   losartan 25 MG tablet Commonly known as: COZAAR Take 25 mg by mouth daily.  methocarbamol 500 MG tablet Commonly known as: ROBAXIN Take 1 tablet (500 mg total) by mouth every 8 (eight) hours as needed for muscle spasms.   nebivolol 10 MG tablet Commonly known as: BYSTOLIC Take 10 mg by mouth every evening.   oxyCODONE 5 MG immediate release tablet Commonly known as: Oxy IR/ROXICODONE Take 1 tablet (5 mg total) by mouth every 4 (four) hours as needed for severe pain. What changed:  how much to take when to take this additional instructions    Ozempic (1 MG/DOSE) 4 MG/3ML Sopn Generic drug: Semaglutide (1 MG/DOSE) Inject 1 mg into the skin once a week.   polyethylene glycol 17 g packet Commonly known as: MIRALAX / GLYCOLAX Take 17 g by mouth daily.   QUERCETIN PO Take 1 capsule by mouth at bedtime. For vitamin C   VITAMIN D3 PO Take 1 tablet by mouth daily.        Diagnostic Studies: DG Lumbar Spine 2-3 Views  Result Date: 07/08/2023 CLINICAL DATA:  Elective surgery. EXAM: LUMBAR SPINE - 2-3 VIEW COMPARISON:  03/24/2023 FINDINGS: Four fluoroscopic spot views of the lumbar spine obtained in the operating room. Posterior rod with intrapedicular screw fusion and interbody spacer at L4-L5. Fluoroscopy time 4 minutes 12 seconds. Dose 329.27 mGy. IMPRESSION: Intraoperative fluoroscopy during L4-L5 fusion. Electronically Signed   By: Narda Rutherford M.D.   On: 07/08/2023 19:28   DG C-Arm 1-60 Min-No Report  Result Date: 07/08/2023 Fluoroscopy was utilized by the requesting physician.  No radiographic interpretation.   DG C-Arm 1-60 Min-No Report  Result Date: 07/08/2023 Fluoroscopy was utilized by the requesting physician.  No radiographic interpretation.   DG C-Arm 1-60 Min-No Report  Result Date: 07/08/2023 Fluoroscopy was utilized by the requesting physician.  No radiographic interpretation.   DG C-Arm 1-60 Min-No Report  Result Date: 07/08/2023 Fluoroscopy was utilized by the requesting physician.  No radiographic interpretation.    Discharge Instructions     Incentive spirometry RT   Complete by: As directed         Follow-up Information     Venita Lick, MD Follow up.   Specialty: Orthopedic Surgery Why: office will call with follow up appointment Contact information: [address] [place] [address] [phone]                 Discharge Plan:  discharge to home   Disposition:  Sonya Watson is a pleasant 57 yr old woman who had a lumbar disectomy in early summer and then  unfortunately developed a recurrent herniation.  As a result she underwent a revision decompression and fusion.  Patient did will post-op.  She was neurologically intact, voiding spontaneously with normal bowel function.  Pain control was an issue early on but eventually she was able to function with oral medications.  She will be discharged with appropriate medications and instructions.  She will follow up in 2 weeks.    Signed: Alvy Beal for Dr. Venita Lick [facility] [phone] 07/14/2023, 1:34 PM
